# Patient Record
Sex: Female | Born: 1985 | Race: Black or African American | Hispanic: No | Marital: Single | Smoking: Current every day smoker
Health system: Southern US, Community
[De-identification: ages and names within clinical notes are randomized; demographics above are authoritative.]

## PROBLEM LIST (undated history)

## (undated) DIAGNOSIS — E282 Polycystic ovarian syndrome: Secondary | ICD-10-CM

## (undated) HISTORY — PX: WISDOM TOOTH EXTRACTION: SHX21

---
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
## 2006-01-18 ENCOUNTER — Emergency Department: Payer: Self-pay | Admitting: Emergency Medicine

## 2006-03-21 ENCOUNTER — Emergency Department: Payer: Self-pay | Admitting: Emergency Medicine

## 2007-03-29 ENCOUNTER — Emergency Department: Payer: Self-pay | Admitting: Emergency Medicine

## 2008-01-12 ENCOUNTER — Emergency Department: Payer: Self-pay | Admitting: Internal Medicine

## 2008-04-24 ENCOUNTER — Emergency Department (HOSPITAL_COMMUNITY): Admission: EM | Admit: 2008-04-24 | Discharge: 2008-04-24 | Payer: Self-pay | Admitting: Emergency Medicine

## 2008-10-28 ENCOUNTER — Emergency Department: Payer: Self-pay | Admitting: Emergency Medicine

## 2010-04-03 ENCOUNTER — Emergency Department: Payer: Self-pay | Admitting: Emergency Medicine

## 2014-08-21 ENCOUNTER — Emergency Department: Payer: Self-pay | Admitting: Emergency Medicine

## 2014-09-08 ENCOUNTER — Emergency Department: Payer: Self-pay | Admitting: Emergency Medicine

## 2014-09-08 LAB — CBC WITH DIFFERENTIAL/PLATELET
Basophil #: 0.1 10*3/uL (ref 0.0–0.1)
Basophil %: 0.9 %
EOS ABS: 0.3 10*3/uL (ref 0.0–0.7)
EOS PCT: 3.1 %
HCT: 42.1 % (ref 35.0–47.0)
HGB: 13.5 g/dL (ref 12.0–16.0)
Lymphocyte #: 2.6 10*3/uL (ref 1.0–3.6)
Lymphocyte %: 27.6 %
MCH: 29.4 pg (ref 26.0–34.0)
MCHC: 32 g/dL (ref 32.0–36.0)
MCV: 92 fL (ref 80–100)
MONO ABS: 0.7 x10 3/mm (ref 0.2–0.9)
Monocyte %: 8 %
Neutrophil #: 5.6 10*3/uL (ref 1.4–6.5)
Neutrophil %: 60.4 %
PLATELETS: 289 10*3/uL (ref 150–440)
RBC: 4.58 10*6/uL (ref 3.80–5.20)
RDW: 12.8 % (ref 11.5–14.5)
WBC: 9.3 10*3/uL (ref 3.6–11.0)

## 2014-09-08 LAB — LIPASE, BLOOD: LIPASE: 101 U/L (ref 73–393)

## 2014-09-08 LAB — URINALYSIS, COMPLETE
BACTERIA: NONE SEEN
Bilirubin,UR: NEGATIVE
Blood: NEGATIVE
GLUCOSE, UR: NEGATIVE mg/dL (ref 0–75)
Ketone: NEGATIVE
LEUKOCYTE ESTERASE: NEGATIVE
NITRITE: NEGATIVE
Ph: 6 (ref 4.5–8.0)
Protein: NEGATIVE
RBC,UR: NONE SEEN /HPF (ref 0–5)
SPECIFIC GRAVITY: 1.013 (ref 1.003–1.030)
Squamous Epithelial: 3

## 2014-09-08 LAB — COMPREHENSIVE METABOLIC PANEL
AST: 24 U/L (ref 15–37)
Albumin: 4.1 g/dL (ref 3.4–5.0)
Alkaline Phosphatase: 53 U/L
Anion Gap: 10 (ref 7–16)
BUN: 11 mg/dL (ref 7–18)
Bilirubin,Total: 0.8 mg/dL (ref 0.2–1.0)
CALCIUM: 8.6 mg/dL (ref 8.5–10.1)
CHLORIDE: 103 mmol/L (ref 98–107)
CO2: 26 mmol/L (ref 21–32)
Creatinine: 0.9 mg/dL (ref 0.60–1.30)
Glucose: 103 mg/dL — ABNORMAL HIGH (ref 65–99)
Osmolality: 277 (ref 275–301)
Potassium: 3.7 mmol/L (ref 3.5–5.1)
SGPT (ALT): 20 U/L
SODIUM: 139 mmol/L (ref 136–145)
TOTAL PROTEIN: 8.1 g/dL (ref 6.4–8.2)

## 2015-01-11 ENCOUNTER — Emergency Department: Payer: Self-pay | Admitting: Emergency Medicine

## 2015-04-21 ENCOUNTER — Encounter: Payer: Self-pay | Admitting: Emergency Medicine

## 2015-04-21 ENCOUNTER — Emergency Department: Payer: Self-pay

## 2015-04-21 ENCOUNTER — Emergency Department
Admission: EM | Admit: 2015-04-21 | Discharge: 2015-04-21 | Disposition: A | Discharge status: Home / Self Care | Payer: Self-pay | Attending: Emergency Medicine | Admitting: Emergency Medicine

## 2015-04-21 DIAGNOSIS — X58XXXA Exposure to other specified factors, initial encounter: Secondary | ICD-10-CM | POA: Insufficient documentation

## 2015-04-21 DIAGNOSIS — M79662 Pain in left lower leg: Secondary | ICD-10-CM

## 2015-04-21 DIAGNOSIS — Y9389 Activity, other specified: Secondary | ICD-10-CM | POA: Insufficient documentation

## 2015-04-21 DIAGNOSIS — Z720 Tobacco use: Secondary | ICD-10-CM | POA: Insufficient documentation

## 2015-04-21 DIAGNOSIS — S86912A Strain of unspecified muscle(s) and tendon(s) at lower leg level, left leg, initial encounter: Secondary | ICD-10-CM

## 2015-04-21 DIAGNOSIS — Y9289 Other specified places as the place of occurrence of the external cause: Secondary | ICD-10-CM | POA: Insufficient documentation

## 2015-04-21 DIAGNOSIS — Y990 Civilian activity done for income or pay: Secondary | ICD-10-CM | POA: Insufficient documentation

## 2015-04-21 MED ORDER — IBUPROFEN 800 MG PO TABS
ORAL_TABLET | ORAL | Status: AC
  Administered 2015-04-21: 800 mg via ORAL
  Filled 2015-04-21: qty 1

## 2015-04-21 MED ORDER — IBUPROFEN 800 MG PO TABS
800.0000 mg | ORAL_TABLET | Freq: Once | ORAL | Status: AC
  Administered 2015-04-21: 800 mg via ORAL

## 2015-04-21 NOTE — ED Notes (Signed)
At work . Left posterior knee pain and swelling, no injury recalled

## 2015-04-21 NOTE — ED Notes (Signed)
Pt to u/s

## 2015-04-21 NOTE — Discharge Instructions (Signed)
Muscle Strain A muscle strain is an injury that occurs when a muscle is stretched beyond its normal length. Usually a small number of muscle fibers are torn when this happens. Muscle strain is rated in degrees. First-degree strains have the least amount of muscle fiber tearing and pain. Second-degree and third-degree strains have increasingly more tearing and pain.  Usually, recovery from muscle strain takes 1-2 weeks. Complete healing takes 5-6 weeks.  CAUSES  Muscle strain happens when a sudden, violent force placed on a muscle stretches it too far. This may occur with lifting, sports, or a fall.  RISK FACTORS Muscle strain is especially common in athletes.  SIGNS AND SYMPTOMS At the site of the muscle strain, there may be:  Pain.  Bruising.  Swelling.  Difficulty using the muscle due to pain or lack of normal function. DIAGNOSIS  Your health care provider will perform a physical exam and ask about your medical history. TREATMENT  Often, the best treatment for a muscle strain is resting, icing, and applying cold compresses to the injured area.  HOME CARE INSTRUCTIONS   Use the PRICE method of treatment to promote muscle healing during the first 2-3 days after your injury. The PRICE method involves:  Protecting the muscle from being injured again.  Restricting your activity and resting the injured body part.  Icing your injury. To do this, put ice in a plastic bag. Place a towel between your skin and the bag. Then, apply the ice and leave it on from 15-20 minutes each hour. After the third day, switch to moist heat packs.  Apply compression to the injured area with a splint or elastic bandage. Be careful not to wrap it too tightly. This may interfere with blood circulation or increase swelling.  Elevate the injured body part above the level of your heart as often as you can.  Only take over-the-counter or prescription medicines for pain, discomfort, or fever as directed by your  health care provider.  Warming up prior to exercise helps to prevent future muscle strains. SEEK MEDICAL CARE IF:   You have increasing pain or swelling in the injured area.  You have numbness, tingling, or a significant loss of strength in the injured area. MAKE SURE YOU:   Understand these instructions.  Will watch your condition.  Will get help right away if you are not doing well or get worse. Document Released: 11/10/2005 Document Revised: 08/31/2013 Document Reviewed: 06/09/2013 [facility] BradfordExitCare Patient Information 2015 [employer], [place]. This information is not intended to replace advice given to you by your health care provider. Make sure you discuss any questions you have with your health care provider.  Your exam and ultrasound were normal. You likely have a strain to the calf muscle.  You should apply ice to reduce symptoms. Take OTC Tylenol and Motrin for symptoms. Consider compression socks and arch supports in your shoes.

## 2015-04-21 NOTE — ED Provider Notes (Signed)
[facility] ?____________________________________________ ? Time seen: 1215 ? I have reviewed the triage vital signs and the nursing notes. ________ HISTORY ? Chief Complaint Knee Pain  HPI  Kathleen Hardy is a 29 y.o. female reports to the ED with posterior left knee swelling with sudden onset this morning while at work. She describes she started her day at 4 AM at work, and by 8 AM she had increasing pain primarily to the posterior portion of her knee at the calf. She describes reaching behind detected back of her knee and at about 9 AM noted what she thought was swelling to the back of the knee. 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 She denies history of knee problems, distal paresthesias, lower extremity edema, or difficulty walking. She rates her pain at a 6 out of 10 but denies dosed any medication for symptom relief. She is here at the advice of her supervisor for medical intervention.  History reviewed. No pertinent past medical history.  There are no active problems to display for this patient.  History reviewed. No pertinent past surgical history. ? No current outpatient prescriptions on file. ? Allergies Review of patient's allergies indicates no known allergies. ? No family history on file. ? Social History History  Substance Use Topics  . Smoking status: Current Every Day Smoker -- 0.50 packs/day    Types: Cigarettes  . Smokeless tobacco: Not on file  . Alcohol Use: Not on file   Review of Systems  Constitutional: Negative for fever. HEENT: Negative for head trauma, visual changes, sore throat. Cardiovascular: Negative for chest pain. Respiratory: Negative for shortness of breath. Musculoskeletal: Negative for back pain. Positive for left posterior knee pain. Skin: Negative for  rash. Neurological: Negative for headaches, focal weakness or numbness.  10-point ROS otherwise negative. ____________________________________________  PHYSICAL EXAM:  VITAL SIGNS: ED Triage Vitals  Enc Vitals Group     BP 04/21/15 1119 117/71 mmHg     Pulse Rate 04/21/15 1119 71     Resp --      Temp 04/21/15 1119 98 F (36.7 C)     Temp Source 04/21/15 1119 Oral     SpO2 04/21/15 1119 100 %     Weight 04/21/15 1119 154 lb (69.854 kg)     Height 04/21/15 1119 5\' 6"  (1.676 m)     Head Cir --      Peak Flow --      Pain Score 04/21/15 1120 6     Pain Loc --      Pain Edu? --      Excl. in GC? --    Constitutional: Alert and oriented. Well appearing and in no distress. HEENT:Normocephalic and atraumatic.  PERRL. Normal extraocular movements.  No congestion/rhinnorhea. Mucous membranes are moist. Neck: Supple. No cervical lymphadenopathy. Cardiovascular: Normal rate, regular rhythm. No murmurs, rubs, or gallops. Normal and symmetric distal pulses are present in all extremities.  Respiratory: Normal respiratory effort without tachypnea. Breath sounds are clear and equal bilaterally. No wheezes/rales/rhonchi. Gastrointestinal: Soft and nontender. No distention. No abdominal bruits. There is no CVA tenderness. Musculoskeletal: Left knee without deformity, edema, effusion, or skin changes. Popliteal space without fullness. Mildly tender to palp at the calf insertion. No palpable cords or phlebitis. Normal, full ROM of the left knee. Nontender with normal range of motion in all extremities. No joint effusions.  No lower extremity edema. Neurologic:  Normal speech and language. CN II-XII grossly intact. No gait instability. Skin:  Skin is warm, dry and intact. No rash noted. Psychiatric: Mood and affect are normal. Patient exhibits appropriate insight and judgment. ___________ RADIOLOGY  LLE Ultrasound  FINDINGS: No soft tissue mass. No fluid collection.  IMPRESSION: Popliteal  fossa appears sonographically normal. _____________________________________________________ INITIAL IMPRESSION / ASSESSMENT AND PLAN / ED COURSE ? Radiology results to the patient. Clinical exam consistent with calf strain and soft tissue tenderness without or vascular deficit.  Suggest treatment with compression socks, rest, and NSAIDs. Referral to ortho for further care.   ____________________________________________ FINAL CLINICAL IMPRESSION(S) / ED DIAGNOSES?  Final diagnoses:  Knee strain, left, initial encounter  Tenderness of left calf      Lissa Hoard, PA-C 04/21/15 1918  Minna Antis, MD 04/22/15 1248

## 2015-05-19 ENCOUNTER — Emergency Department
Admission: EM | Admit: 2015-05-19 | Discharge: 2015-05-19 | Disposition: A | Discharge status: Home / Self Care | Payer: Self-pay | Attending: Emergency Medicine | Admitting: Emergency Medicine

## 2015-05-19 ENCOUNTER — Encounter: Payer: Self-pay | Admitting: Emergency Medicine

## 2015-05-19 DIAGNOSIS — Z3202 Encounter for pregnancy test, result negative: Secondary | ICD-10-CM | POA: Insufficient documentation

## 2015-05-19 DIAGNOSIS — Z720 Tobacco use: Secondary | ICD-10-CM | POA: Insufficient documentation

## 2015-05-19 DIAGNOSIS — K922 Gastrointestinal hemorrhage, unspecified: Secondary | ICD-10-CM | POA: Insufficient documentation

## 2015-05-19 DIAGNOSIS — K649 Unspecified hemorrhoids: Secondary | ICD-10-CM | POA: Insufficient documentation

## 2015-05-19 DIAGNOSIS — K625 Hemorrhage of anus and rectum: Secondary | ICD-10-CM

## 2015-05-19 LAB — COMPREHENSIVE METABOLIC PANEL
ALT: 15 U/L (ref 14–54)
ANION GAP: 5 (ref 5–15)
AST: 19 U/L (ref 15–41)
Albumin: 4 g/dL (ref 3.5–5.0)
Alkaline Phosphatase: 55 U/L (ref 38–126)
BUN: 13 mg/dL (ref 6–20)
CALCIUM: 8.7 mg/dL — AB (ref 8.9–10.3)
CO2: 27 mmol/L (ref 22–32)
Chloride: 106 mmol/L (ref 101–111)
Creatinine, Ser: 0.83 mg/dL (ref 0.44–1.00)
GFR calc Af Amer: >60 mL/min (ref >60)
GFR calc non Af Amer: >60 mL/min (ref >60)
Glucose, Bld: 111 mg/dL — ABNORMAL HIGH (ref 65–99)
Potassium: 4.3 mmol/L (ref 3.5–5.1)
SODIUM: 138 mmol/L (ref 135–145)
TOTAL PROTEIN: 7.1 g/dL (ref 6.5–8.1)
Total Bilirubin: 0.4 mg/dL (ref 0.3–1.2)

## 2015-05-19 LAB — CBC
HCT: 39.6 % (ref 35.0–47.0)
HEMOGLOBIN: 12.9 g/dL (ref 12.0–16.0)
MCH: 29.9 pg (ref 26.0–34.0)
MCHC: 32.5 g/dL (ref 32.0–36.0)
MCV: 91.8 fL (ref 80.0–100.0)
Platelets: 267 10*3/uL (ref 150–440)
RBC: 4.31 MIL/uL (ref 3.80–5.20)
RDW: 13.1 % (ref 11.5–14.5)
WBC: 9.1 10*3/uL (ref 3.6–11.0)

## 2015-05-19 LAB — TYPE AND SCREEN
ABO/RH(D): O POS
ANTIBODY SCREEN: NEGATIVE

## 2015-05-19 LAB — ABO/RH: ABO/RH(D): O POS

## 2015-05-19 LAB — POCT PREGNANCY, URINE: PREG TEST UR: NEGATIVE

## 2015-05-19 NOTE — ED Notes (Signed)
POC Preg- Negative ?

## 2015-05-19 NOTE — ED Provider Notes (Signed)
[facility] Emergency Department Provider Note  ____________________________________________  Time seen: Approximately 9:30 AM  I have reviewed the triage vital signs and the nursing notes.   HISTORY  Chief Complaint GI Bleeding    HPI Kathleen Hardy is a 29 y.o. female without any medical problems presents today with 1 week of bleeding with defecation. The patient says that every time she has a bowel movement, which is 2 times per day, she has some bright red blood streaking on her stool. She denies pushing hard to move her bowels. She says that she has had diarrhea now for the last 2 days. No known sick contacts. No recent antibiotics. Says that she has some mild pain in her lower abdomen that has been constant for the past week. Says that it is possible that she is pregnant. No nausea or vomiting. Denies any vaginal bleeding, dysuria. Denies any vaginal discharge.   No past medical history on file.  There are no active problems to display for this patient.   No past surgical history on file.  No current outpatient prescriptions on file.  Allergies Review of patient's allergies indicates no known allergies.  No family history on file.  Social History History  Substance Use Topics  . Smoking status: Current Every Day Smoker -- 0.50 packs/day for 10 years    Types: Cigarettes  . Smokeless tobacco: Not on file  . Alcohol Use: Yes     Comment: occasionally    Review of Systems Constitutional: No fever/chills Eyes: No visual changes. ENT: No sore throat. Cardiovascular: Denies chest pain. Respiratory: Denies shortness of breath. Gastrointestinal: Suprapubic abdominal pain.  No nausea, no vomiting.   No constipation. Genitourinary: Negative for dysuria. Musculoskeletal: Negative for back pain. Skin: Negative for rash. Neurological: Negative for headaches, focal weakness or numbness.  10-point ROS otherwise  negative.  ____________________________________________   PHYSICAL EXAM:  VITAL SIGNS: ED Triage Vitals  Enc Vitals Group     BP 05/19/15 0700 124/78 mmHg     Pulse Rate 05/19/15 0700 71     Resp 05/19/15 0700 18     Temp 05/19/15 0700 98.2 F (36.8 C)     Temp Source 05/19/15 0700 Oral     SpO2 05/19/15 0700 100 %     Weight 05/19/15 0700 155 lb (70.308 kg)     Height 05/19/15 0700 5\' 6"  (1.676 m)     Head Cir --      Peak Flow --      Pain Score 05/19/15 0700 3     Pain Loc --      Pain Edu? --      Excl. in GC? --     Constitutional: Alert and oriented. Well appearing and in no acute distress. Eyes: Conjunctivae are normal. PERRL. EOMI. Head: Atraumatic. Nose: No congestion/rhinnorhea. Mouth/Throat: Mucous membranes are moist.  Oropharynx non-erythematous. Neck: No stridor.   Cardiovascular: Normal rate, regular rhythm. Grossly normal heart sounds.  Good peripheral circulation. Respiratory: Normal respiratory effort.  No retractions. Lungs CTAB. Gastrointestinal: Soft with mild tenderness suprapubic.Marland Kitchen No distention. No abdominal bruits. No CVA tenderness. 1 non-engorged hemorrhoid at 6:00. There are no fissures. Brown stool on digital exam. Mildly heme positive. Musculoskeletal: No lower extremity tenderness nor edema.  No joint effusions. Neurologic:  Normal speech and language. No gross focal neurologic deficits are appreciated. Speech is normal. No gait instability. Skin:  Skin is warm, dry and intact. No rash noted. Psychiatric: Mood and affect are normal. Speech and  behavior are normal.  ____________________________________________   LABS (all labs ordered are listed, but only abnormal results are displayed)  Labs Reviewed  COMPREHENSIVE METABOLIC PANEL - Abnormal; Notable for the following:    Glucose, Bld 111 (*)    Calcium 8.7 (*)    All other components within normal limits  CBC  TYPE AND SCREEN  ABO/RH    ____________________________________________  EKG   ____________________________________________  RADIOLOGY   ____________________________________________   PROCEDURES    ____________________________________________   INITIAL IMPRESSION / ASSESSMENT AND PLAN / ED COURSE  Pertinent labs & imaging results that were available during my care of the patient were reviewed by me and considered in my medical decision making (see chart for details).  Patient with presentation consistent with bleeding from hemorrhoid. Labs and exam are reassuring. Counseled patient that she should try Preparation H for symptom relief. Will not start on stool softener because patient having diarrhea at this time. Unclear cause for abdominal pain however normal white count with only mild tenderness on exam. ____________________________________________   FINAL CLINICAL IMPRESSION(S) / ED DIAGNOSES  Acute red blood per rectum. Acute external hemorrhoid. Initial visit.    Myrna Blazer, MD 05/19/15 1011

## 2015-05-19 NOTE — ED Notes (Signed)
Patient here stating that she has been having bright red rectal bleeding times one week.

## 2015-05-19 NOTE — Discharge Instructions (Signed)
Bloody Stools  Bloody stools often mean that there is a problem in the digestive tract. Your caregiver may use the term "melena" to describe black, tarry, and bad smelling stools or "hematochezia" to describe red or maroon-colored stools. Blood seen in the stool can be caused by bleeding anywhere along the intestinal tract.   A black stool usually means that blood is coming from the upper part of the gastrointestinal tract (esophagus, stomach, or small bowel). Passing maroon-colored stools or bright red blood usually means that blood is coming from lower down in the large bowel or the rectum. However, sometimes massive bleeding in the stomach or small intestine can cause bright red bloody stools.   Consuming black licorice, lead, iron pills, medicines containing bismuth subsalicylate, or blueberries can also cause black stools. Your caregiver can test black stools to see if blood is present.  It is important that the cause of the bleeding be found. Treatment can then be started, and the problem can be corrected. Rectal bleeding may not be serious, but you should not assume everything is okay until you know the cause. It is very important to follow up with your caregiver or a specialist in gastrointestinal problems.  CAUSES   Blood in the stools can come from various underlying causes. Often, the cause is not found during your first visit. Testing is often needed to discover the cause of bleeding in the gastrointestinal tract. Causes range from simple to serious or even life-threatening. Possible causes include:  · Hemorrhoids. These are veins that are full of blood (engorged) in the rectum. They cause pain, inflammation, and may bleed.  · Anal fissures. These are areas of painful tearing which may bleed. They are often caused by passing hard stool.  · Diverticulosis. These are pouches that form on the colon over time, with age, and may bleed significantly.  · Diverticulitis. This is inflammation in areas with  diverticulosis. It can cause pain, fever, and bloody stools, although bleeding is rare.  · Proctitis and colitis. These are inflamed areas of the rectum or colon. They may cause pain, fever, and bloody stools.  · Polyps and cancer. Colon cancer is a leading cause of preventable cancer death. It often starts out as precancerous polyps that can be removed during a colonoscopy, preventing progression into cancer. Sometimes, polyps and cancer may cause rectal bleeding.  · Gastritis and ulcers. Bleeding from the upper gastrointestinal tract (near the stomach) may travel through the intestines and produce black, sometimes tarry, often bad smelling stools. In certain cases, if the bleeding is fast enough, the stools may not be black, but red and the condition may be life-threatening.  SYMPTOMS   You may have stools that are bright red and bloody, that are normal color with blood on them, or that are dark black and tarry. In some cases, you may only have blood in the toilet bowl. Any of these cases need medical care. You may also have:  · Pain at the anus or anywhere in the rectum.  · Lightheadedness or feeling faint.  · Extreme weakness.  · Nausea or vomiting.  · Fever.  DIAGNOSIS  Your caregiver may use the following methods to find the cause of your bleeding:  · Taking a medical history. Age is important. Older people tend to develop polyps and cancer more often. If there is anal pain and a hard, large stool associated with bleeding, a tear of the anus may be the cause. If blood drips into the toilet after a bowel movement, bleeding hemorrhoids may be the   problem. The color and frequency of the bleeding are additional considerations. In most cases, the medical history provides clues, but seldom the final answer.  · A visual and finger (digital) exam. Your caregiver will inspect the anal area, looking for tears and hemorrhoids. A finger exam can provide information when there is tenderness or a growth inside. In men, the  prostate is also examined.  · Endoscopy. Several types of small, long scopes (endoscopes) are used to view the colon.  ¨ In the office, your caregiver may use a rigid, or more commonly, a flexible viewing sigmoidoscope. This exam is called flexible sigmoidoscopy. It is performed in 5 to 10 minutes.  ¨ A more thorough exam is accomplished with a colonoscope. It allows your caregiver to view the entire 5 to 6 foot long colon. Medicine to help you relax (sedative) is usually given for this exam. Frequently, a bleeding lesion may be present beyond the reach of the sigmoidoscope. So, a colonoscopy may be the best exam to start with. Both exams are usually done on an outpatient basis. This means the patient does not stay overnight in the hospital or surgery center.  ¨ An upper endoscopy may be needed to examine your stomach. Sedation is used and a flexible endoscope is put in your mouth, down to your stomach.  · A barium enema X-ray. This is an X-ray exam. It uses liquid barium inserted by enema into the rectum. This test alone may not identify an actual bleeding point. X-rays highlight abnormal shadows, such as those made by lumps (tumors), diverticuli, or colitis.  TREATMENT   Treatment depends on the cause of your bleeding.   · For bleeding from the stomach or colon, the caregiver doing your endoscopy or colonoscopy may be able to stop the bleeding as part of the procedure.  · Inflammation or infection of the colon can be treated with medicines.  · Many rectal problems can be treated with creams, suppositories, or warm baths.  · Surgery is sometimes needed.  · Blood transfusions are sometimes needed if you have lost a lot of blood.  · For any bleeding problem, let your caregiver know if you take aspirin or other blood thinners regularly.  HOME CARE INSTRUCTIONS   · Take any medicines exactly as prescribed.  · Keep your stools soft by eating a diet high in fiber. Prunes (1 to 3 a day) work well for many people.  · Drink  enough water and fluids to keep your urine clear or pale yellow.  · Take sitz baths if advised. A sitz bath is when you sit in a bathtub with warm water for 10 to 15 minutes to soak, soothe, and cleanse the rectal area.  · If enemas or suppositories are advised, be sure you know how to use them. Tell your caregiver if you have problems with this.  · Monitor your bowel movements to look for signs of improvement or worsening.  SEEK MEDICAL CARE IF:   · You do not improve in the time expected.  · Your condition worsens after initial improvement.  · You develop any new symptoms.  SEEK IMMEDIATE MEDICAL CARE IF:   · You develop severe or prolonged rectal bleeding.  · You vomit blood.  · You feel weak or faint.  · You have a fever.  MAKE SURE YOU:  · Understand these instructions.  · Will watch your condition.  · Will get help right away if you are not doing well or get worse.    Document Released: 10/31/2002 Document Revised: 02/02/2012 Document Reviewed: 03/28/2011  ExitCare® Patient Information ©2015 [employer]. This information is not intended to replace advice given to you by your health care provider. Make sure you discuss any questions you have with your health care provider.

## 2015-05-19 NOTE — ED Notes (Signed)
NAD noted at time of D/C. Pt denies questions or concerns. Pt ambulatory to the lobby at this time. Pt refuses wheelchair at this time.

## 2015-09-09 ENCOUNTER — Emergency Department
Admission: EM | Admit: 2015-09-09 | Discharge: 2015-09-09 | Disposition: A | Discharge status: Home / Self Care | Payer: Self-pay | Attending: Emergency Medicine | Admitting: Emergency Medicine

## 2015-09-09 ENCOUNTER — Encounter: Payer: Self-pay | Admitting: Emergency Medicine

## 2015-09-09 ENCOUNTER — Emergency Department: Payer: Self-pay

## 2015-09-09 DIAGNOSIS — Z720 Tobacco use: Secondary | ICD-10-CM | POA: Insufficient documentation

## 2015-09-09 DIAGNOSIS — R102 Pelvic and perineal pain: Secondary | ICD-10-CM | POA: Insufficient documentation

## 2015-09-09 DIAGNOSIS — Z3202 Encounter for pregnancy test, result negative: Secondary | ICD-10-CM | POA: Insufficient documentation

## 2015-09-09 LAB — COMPREHENSIVE METABOLIC PANEL
ALBUMIN: 3.7 g/dL (ref 3.5–5.0)
ALK PHOS: 40 U/L (ref 38–126)
ALT: 11 U/L — AB (ref 14–54)
AST: 16 U/L (ref 15–41)
Anion gap: 7 (ref 5–15)
BILIRUBIN TOTAL: 0.5 mg/dL (ref 0.3–1.2)
BUN: 12 mg/dL (ref 6–20)
CALCIUM: 8.7 mg/dL — AB (ref 8.9–10.3)
CO2: 24 mmol/L (ref 22–32)
CREATININE: 0.7 mg/dL (ref 0.44–1.00)
Chloride: 107 mmol/L (ref 101–111)
GFR calc Af Amer: >60 mL/min (ref >60)
GFR calc non Af Amer: >60 mL/min (ref >60)
GLUCOSE: 97 mg/dL (ref 65–99)
Potassium: 4 mmol/L (ref 3.5–5.1)
SODIUM: 138 mmol/L (ref 135–145)
TOTAL PROTEIN: 6.5 g/dL (ref 6.5–8.1)

## 2015-09-09 LAB — URINALYSIS COMPLETE WITH MICROSCOPIC (ARMC ONLY)
BILIRUBIN URINE: NEGATIVE
Bacteria, UA: NONE SEEN
GLUCOSE, UA: NEGATIVE mg/dL
KETONES UR: NEGATIVE mg/dL
Leukocytes, UA: NEGATIVE
Nitrite: NEGATIVE
PROTEIN: NEGATIVE mg/dL
Specific Gravity, Urine: 1.024 (ref 1.005–1.030)
pH: 6 (ref 5.0–8.0)

## 2015-09-09 LAB — CBC
HCT: 38.1 % (ref 35.0–47.0)
Hemoglobin: 12.7 g/dL (ref 12.0–16.0)
MCH: 29.7 pg (ref 26.0–34.0)
MCHC: 33.2 g/dL (ref 32.0–36.0)
MCV: 89.5 fL (ref 80.0–100.0)
PLATELETS: 265 10*3/uL (ref 150–440)
RBC: 4.26 MIL/uL (ref 3.80–5.20)
RDW: 12.9 % (ref 11.5–14.5)
WBC: 8.2 10*3/uL (ref 3.6–11.0)

## 2015-09-09 LAB — POCT PREGNANCY, URINE: Preg Test, Ur: NEGATIVE

## 2015-09-09 LAB — LIPASE, BLOOD: LIPASE: 24 U/L (ref 22–51)

## 2015-09-09 LAB — PREGNANCY, URINE: Preg Test, Ur: NEGATIVE

## 2015-09-09 MED ORDER — OXYCODONE-ACETAMINOPHEN 5-325 MG PO TABS
1.0000 | ORAL_TABLET | Freq: Once | ORAL | Status: AC
  Administered 2015-09-09: 1 via ORAL
  Filled 2015-09-09: qty 1

## 2015-09-09 MED ORDER — OXYCODONE-ACETAMINOPHEN 5-325 MG PO TABS: 1.0000 | ORAL_TABLET | Freq: Once | ORAL | Status: DC

## 2015-09-09 MED ORDER — IBUPROFEN 800 MG PO TABS: 800.0000 mg | ORAL_TABLET | Freq: Three times a day (TID) | ORAL | Status: DC | PRN

## 2015-09-09 MED ORDER — IBUPROFEN 800 MG PO TABS
800.0000 mg | ORAL_TABLET | Freq: Once | ORAL | Status: AC
  Administered 2015-09-09: 800 mg via ORAL
  Filled 2015-09-09: qty 1

## 2015-09-09 NOTE — ED Notes (Signed)
MD Kaminski at bedside. 

## 2015-09-09 NOTE — ED Notes (Signed)
Lab called regarding urine preg add on at this time, will add on.

## 2015-09-09 NOTE — Discharge Instructions (Signed)
Your ultrasound was negative. Urine pregnancy test was negative. Your blood tests were okay. Her pain was improved with one Percocet. Take ibuprofen, 3 times a day as needed. If you have additional uncontrolled pain, take Percocet. The pain continues, return to the emergency department. Return to the emergency department as well if you have heavy bleeding, worsening pain, or other urgent concerns. Follow-up with gynecology at [facility].  Pelvic Pain, Female Female pelvic pain can be caused by many different things and start from a variety of places. Pelvic pain refers to pain that is located in the lower half of the abdomen and between your hips. The pain may occur over a short period of time (acute) or may be reoccurring (chronic). The cause of pelvic pain may be related to disorders affecting the female reproductive organs (gynecologic), but it may also be related to the bladder, kidney stones, an intestinal complication, or muscle or skeletal problems. Getting help right away for pelvic pain is important, especially if there has been severe, sharp, or a sudden onset of unusual pain. It is also important to get help right away because some types of pelvic pain can be life threatening.  CAUSES  Below are only some of the causes of pelvic pain. The causes of pelvic pain can be in one of several categories.   Gynecologic.  Pelvic inflammatory disease.  Sexually transmitted infection.  Ovarian cyst or a twisted ovarian ligament (ovarian torsion).  Uterine lining that grows outside the uterus (endometriosis).  Fibroids, cysts, or tumors.  Ovulation.  Pregnancy.  Pregnancy that occurs outside the uterus (ectopic pregnancy).  Miscarriage.  Labor.  Abruption of the placenta or ruptured uterus.  Infection.  Uterine infection (endometritis).  Bladder infection.  Diverticulitis.  Miscarriage related to a uterine infection (septic abortion).  Bladder.  Inflammation of the bladder  (cystitis).  Kidney stone(s).  Gastrointestinal.  Constipation.  Diverticulitis.  Neurologic.  Trauma.  Feeling pelvic pain because of mental or emotional causes (psychosomatic).  Cancers of the bowel or pelvis. EVALUATION  Your caregiver will want to take a careful history of your concerns. This includes recent changes in your health, a careful gynecologic history of your periods (menses), and a sexual history. Obtaining your family history and medical history is also important. Your caregiver may suggest a pelvic exam. A pelvic exam will help identify the location and severity of the pain. It also helps in the evaluation of which organ system may be involved. In order to identify the cause of the pelvic pain and be properly treated, your caregiver may order tests. These tests may include:   A pregnancy test.  Pelvic ultrasonography.  An X-ray exam of the abdomen.  A urinalysis or evaluation of vaginal discharge.  Blood tests. HOME CARE INSTRUCTIONS   Only take over-the-counter or prescription medicines for pain, discomfort, or fever as directed by your caregiver.   Rest as directed by your caregiver.   Eat a balanced diet.   Drink enough fluids to make your urine clear or pale yellow, or as directed.   Avoid sexual intercourse if it causes pain.   Apply warm or cold compresses to the lower abdomen depending on which one helps the pain.   Avoid stressful situations.   Keep a journal of your pelvic pain. Write down when it started, where the pain is located, and if there are things that seem to be associated with the pain, such as food or your menstrual cycle.  Follow up with your caregiver  as directed.  SEEK MEDICAL CARE IF:  Your medicine does not help your pain.  You have abnormal vaginal discharge. SEEK IMMEDIATE MEDICAL CARE IF:   You have heavy bleeding from the vagina.   Your pelvic pain increases.   You feel light-headed or faint.   You  have chills.   You have pain with urination or blood in your urine.   You have uncontrolled diarrhea or vomiting.   You have a fever or persistent symptoms for more than 3 days.  You have a fever and your symptoms suddenly get worse.   You are being physically or sexually abused.   This information is not intended to replace advice given to you by your health care provider. Make sure you discuss any questions you have with your health care provider.   Document Released: 10/07/2004 Document Revised: 08/01/2015 Document Reviewed: 03/01/2012 Elsevier Interactive Patient Education [employer]2016 Elsevier Inc.

## 2015-09-09 NOTE — ED Provider Notes (Signed)
[facility] Emergency Department Provider Note  ____________________________________________  Time seen: 1520  I have reviewed the triage vital signs and the nursing notes.   HISTORY  Chief Complaint Abdominal Pain  pelvic pain with lower back pain    HPI Kathleen Hardy is a 29 y.o. female who reports she will this morning at around 07/01/1928 with lower abdominal pain and pain wrapping around her lower back. She began having her menstrual cycle yesterday. He reports she is sexually active and uses condoms. She denies the use of any other anti-contraceptive. She reports that her menstrual cycle just came on approximately 3 days late with this episode. She confirms that she sometimes has cramping with her., But she reports this felt worse then usual. She is not taking any medications today as she "doesn't take medications". She denies any nausea, vomiting or diarrhea.   History reviewed. No pertinent past medical history.  There are no active problems to display for this patient.   History reviewed. No pertinent past surgical history.  Current Outpatient Rx  Name  Route  Sig  Dispense  Refill  . ibuprofen (ADVIL,MOTRIN) 800 MG tablet   Oral   Take 1 tablet (800 mg total) by mouth every 8 (eight) hours as needed.   24 tablet   0   . oxyCODONE-acetaminophen (PERCOCET/ROXICET) 5-325 MG tablet   Oral   Take 1 tablet by mouth once.   6 tablet   0     Allergies Shellfish allergy  No family history on file.  Social History Social History  Substance Use Topics  . Smoking status: Current Every Day Smoker -- 0.50 packs/day for 10 years    Types: Cigarettes  . Smokeless tobacco: Never Used  . Alcohol Use: Yes     Comment: occasionally    Review of Systems  Constitutional: Negative for fever. ENT: Negative for sore throat. Cardiovascular: Negative for chest pain. Respiratory: Negative for cough. Gastrointestinal: Lower abdominal pain. See  history of present illness no nausea or vomiting or diarrhea.. Genitourinary: Currently on menstrual period. Reports lower abdominal/pelvic discomfort. See history of present illness Musculoskeletal: No myalgias or injuries. Skin: Negative for rash. Neurological: Negative for paresthesia or weakness   10-point ROS otherwise negative.  ____________________________________________   PHYSICAL EXAM:  VITAL SIGNS: ED Triage Vitals  Enc Vitals Group     BP 09/09/15 1431 127/79 mmHg     Pulse Rate 09/09/15 1431 73     Resp 09/09/15 1431 18     Temp 09/09/15 1431 98.6 F (37 C)     Temp Source 09/09/15 1431 Oral     SpO2 09/09/15 1431 98 %     Weight 09/09/15 1431 150 lb (68.04 kg)     Height 09/09/15 1431  (1.702 m)     Head Cir --      Peak Flow --      Pain Score 09/09/15 1433 8     Pain Loc --      Pain Edu? --      Excl. in GC? --     Constitutional: Alert and oriented. Well appearing and in no distress. ENT   Head: Normocephalic and atraumatic.   Nose: No congestion/rhinnorhea.    Cardiovascular: Normal rate, regular rhythm, no murmur noted Respiratory:  Normal respiratory effort, no tachypnea.    Breath sounds are clear and equal bilaterally.  Gastrointestinal: Soft. No distention.  Mild tenderness to the suprapubic area. Back: No muscle spasm, no tenderness, no CVA  tenderness. Musculoskeletal: No deformity noted. Nontender with normal range of motion in all extremities.  No noted edema. Neurologic:  Normal speech and language. No gross focal neurologic deficits are appreciated.  Skin:  Skin is warm, dry. No rash noted. Psychiatric: Mood and affect are normal. Speech and behavior are normal.  ____________________________________________    LABS (pertinent positives/negatives)  Labs Reviewed  COMPREHENSIVE METABOLIC PANEL - Abnormal; Notable for the following:    Calcium 8.7 (*)    ALT 11 (*)    All other components within normal limits  URINALYSIS  COMPLETEWITH MICROSCOPIC ([facility] ONLY) - Abnormal; Notable for the following:    Color, Urine YELLOW (*)    APPearance CLEAR (*)    Hgb urine dipstick 3+ (*)    Squamous Epithelial / LPF 6-30 (*)    All other components within normal limits  LIPASE, BLOOD  CBC  PREGNANCY, URINE  POCT PREGNANCY, URINE     ____________________________________________   Radiology  Pelvic ultrasound  IMPRESSION: Normal transabdominal and endovaginal pelvic ultrasound. ____________________________________________   INITIAL IMPRESSION / ASSESSMENT AND PLAN / ED COURSE  Pertinent labs & imaging results that were available during my care of the patient were reviewed by me and considered in my medical decision making (see chart for details).  Well-appearing 29 year old female in no acute distress. She has lower abdominal/pelvic pain and lower back pain. She has no CVA tenderness or musculoskeletal tenderness in her back. The patient appears to be having menstrual cramps. We'll treat her with 800 mg of ibuprofen. Labs ordered upfront based on protocol. We will wait for the results. Urine currently shows red blood cells too numerous to count but no sign of infection. Urine pregnancy is pending.  ----------------------------------------- 4:17 PM on 09/09/2015 -----------------------------------------  Urine pregnancy is negative. Blood tests overall look good with a white blood cell count of 8.2. Hemoglobin of 12.7.  Reexamination patient after ibuprofen finds her more uncomfortable. She is a little bit tearful and unable to sit still. The pain is primarily in the left lower quadrant. We will treat her pain with a Percocet and obtain an ultrasound.  ----------------------------------------- 7:06 PM on 09/09/2015 -----------------------------------------  Patient with negative ultrasound. No adnexal mass.  At this time the patient appears significantly more comfortable. She is no acute distress. I  reviewed the results of her ultrasound with her. I have counseled her to take ibuprofen 3 times a day. I will prescribe Percocet, dispensed #6, in case of a return of more severe pain.  I counseled patient to follow up with GYN and [facility].  ____________________________________________   FINAL CLINICAL IMPRESSION(S) / ED DIAGNOSES  Final diagnoses:  Pelvic pain in female      Darien Ramusavid W Gurpreet Mikhail, MD 09/09/15 1930

## 2015-09-09 NOTE — ED Notes (Signed)
Developed lower abd/back cramping this am. Denies any n/v or fever

## 2015-09-09 NOTE — ED Notes (Signed)
Pt at US at this time, will administer meds once returns.

## 2015-09-09 NOTE — ED Notes (Signed)
Pt returned from US at this time.

## 2016-02-12 ENCOUNTER — Emergency Department
Admission: EM | Admit: 2016-02-12 | Discharge: 2016-02-12 | Disposition: A | Discharge status: Home / Self Care | Payer: Self-pay | Attending: Emergency Medicine | Admitting: Emergency Medicine

## 2016-02-12 ENCOUNTER — Emergency Department: Payer: Self-pay

## 2016-02-12 DIAGNOSIS — J069 Acute upper respiratory infection, unspecified: Secondary | ICD-10-CM | POA: Insufficient documentation

## 2016-02-12 DIAGNOSIS — F1290 Cannabis use, unspecified, uncomplicated: Secondary | ICD-10-CM | POA: Insufficient documentation

## 2016-02-12 DIAGNOSIS — Z91013 Allergy to seafood: Secondary | ICD-10-CM | POA: Insufficient documentation

## 2016-02-12 DIAGNOSIS — F17210 Nicotine dependence, cigarettes, uncomplicated: Secondary | ICD-10-CM | POA: Insufficient documentation

## 2016-02-12 LAB — BASIC METABOLIC PANEL
ANION GAP: 4 — AB (ref 5–15)
BUN: 11 mg/dL (ref 6–20)
CALCIUM: 8.6 mg/dL — AB (ref 8.9–10.3)
CO2: 23 mmol/L (ref 22–32)
CREATININE: 0.74 mg/dL (ref 0.44–1.00)
Chloride: 107 mmol/L (ref 101–111)
Glucose, Bld: 105 mg/dL — ABNORMAL HIGH (ref 65–99)
Potassium: 3.2 mmol/L — ABNORMAL LOW (ref 3.5–5.1)
SODIUM: 134 mmol/L — AB (ref 135–145)

## 2016-02-12 LAB — CBC WITH DIFFERENTIAL/PLATELET
BASOS ABS: 0 10*3/uL (ref 0–0.1)
BASOS PCT: 1 %
EOS ABS: 0.1 10*3/uL (ref 0–0.7)
Eosinophils Relative: 1 %
HCT: 36.8 % (ref 35.0–47.0)
HEMOGLOBIN: 12.4 g/dL (ref 12.0–16.0)
Lymphocytes Relative: 34 %
Lymphs Abs: 2 10*3/uL (ref 1.0–3.6)
MCH: 29.3 pg (ref 26.0–34.0)
MCHC: 33.6 g/dL (ref 32.0–36.0)
MCV: 87.2 fL (ref 80.0–100.0)
MONO ABS: 0.8 10*3/uL (ref 0.2–0.9)
MONOS PCT: 14 %
NEUTROS ABS: 3 10*3/uL (ref 1.4–6.5)
NEUTROS PCT: 50 %
Platelets: 210 10*3/uL (ref 150–440)
RBC: 4.22 MIL/uL (ref 3.80–5.20)
RDW: 12.8 % (ref 11.5–14.5)
WBC: 6 10*3/uL (ref 3.6–11.0)

## 2016-02-12 LAB — RAPID INFLUENZA A&B ANTIGENS (ARMC ONLY)
INFLUENZA A (ARMC): NEGATIVE
INFLUENZA B (ARMC): NEGATIVE

## 2016-02-12 MED ORDER — AZITHROMYCIN 250 MG PO TABS: ORAL_TABLET | ORAL | Status: DC

## 2016-02-12 MED ORDER — GUAIFENESIN-CODEINE 100-10 MG/5ML PO SOLN: 10.0000 mL | ORAL | Status: DC | PRN

## 2016-02-12 MED ORDER — SODIUM CHLORIDE 0.9 % IV BOLUS (SEPSIS)
1000.0000 mL | Freq: Once | INTRAVENOUS | Status: AC
  Administered 2016-02-12: 1000 mL via INTRAVENOUS

## 2016-02-12 MED ORDER — PSEUDOEPHEDRINE HCL 60 MG PO TABS: 60.0000 mg | ORAL_TABLET | ORAL | Status: DC | PRN

## 2016-02-12 MED ORDER — CHLORPHENIRAMINE MALEATE 4 MG PO TABS: 4.0000 mg | ORAL_TABLET | Freq: Two times a day (BID) | ORAL | Status: DC | PRN

## 2016-02-12 NOTE — Discharge Instructions (Signed)
Upper Respiratory Infection, Adult Most upper respiratory infections (URIs) are a viral infection of the air passages leading to the lungs. A URI affects the nose, throat, and upper air passages. The most common type of URI is nasopharyngitis and is typically referred to as "the common cold." URIs run their course and usually go away on their own. Most of the time, a URI does not require medical attention, but sometimes a bacterial infection in the upper airways can follow a viral infection. This is called a secondary infection. Sinus and middle ear infections are common types of secondary upper respiratory infections. Bacterial pneumonia can also complicate a URI. A URI can worsen asthma and chronic obstructive pulmonary disease (COPD). Sometimes, these complications can require emergency medical care and may be life threatening.  CAUSES Almost all URIs are caused by viruses. A virus is a type of germ and can spread from one person to another.  RISKS FACTORS You may be at risk for a URI if:   You smoke.   You have chronic heart or lung disease.  You have a weakened defense (immune) system.   You are very young or very old.   You have nasal allergies or asthma.  You work in crowded or poorly ventilated areas.  You work in health care facilities or schools. SIGNS AND SYMPTOMS  Symptoms typically develop 2-3 days after you come in contact with a cold virus. Most viral URIs last 7-10 days. However, viral URIs from the influenza virus (flu virus) can last 14-18 days and are typically more severe. Symptoms may include:   Runny or stuffy (congested) nose.   Sneezing.   Cough.   Sore throat.   Headache.   Fatigue.   Fever.   Loss of appetite.   Pain in your forehead, behind your eyes, and over your cheekbones (sinus pain).  Muscle aches.  DIAGNOSIS  Your health care provider may diagnose a URI by:  Physical exam.  Tests to check that your symptoms are not due to  another condition such as:  Strep throat.  Sinusitis.  Pneumonia.  Asthma. TREATMENT  A URI goes away on its own with time. It cannot be cured with medicines, but medicines may be prescribed or recommended to relieve symptoms. Medicines may help:  Reduce your fever.  Reduce your cough.  Relieve nasal congestion. HOME CARE INSTRUCTIONS   Take medicines only as directed by your health care provider.   Gargle warm saltwater or take cough drops to comfort your throat as directed by your health care provider.  Use a warm mist humidifier or inhale steam from a shower to increase air moisture. This may make it easier to breathe.  Drink enough fluid to keep your urine clear or pale yellow.   Eat soups and other clear broths and maintain good nutrition.   Rest as needed.   Return to work when your temperature has returned to normal or as your health care provider advises. You may need to stay home longer to avoid infecting others. You can also use a face mask and careful hand washing to prevent spread of the virus.  Increase the usage of your inhaler if you have asthma.   Do not use any tobacco products, including cigarettes, chewing tobacco, or electronic cigarettes. If you need help quitting, ask your health care provider. PREVENTION  The best way to protect yourself from getting a cold is to practice good hygiene.   Avoid oral or hand contact with people with cold   symptoms.   Wash your hands often if contact occurs.  There is no clear evidence that vitamin C, vitamin E, echinacea, or exercise reduces the chance of developing a cold. However, it is always recommended to get plenty of rest, exercise, and practice good nutrition.  SEEK MEDICAL CARE IF:   You are getting worse rather than better.   Your symptoms are not controlled by medicine.   You have chills.  You have worsening shortness of breath.  You have brown or red mucus.  You have yellow or brown nasal  discharge.  You have pain in your face, especially when you bend forward.  You have a fever.  You have swollen neck glands.  You have pain while swallowing.  You have white areas in the back of your throat. SEEK IMMEDIATE MEDICAL CARE IF:   You have severe or persistent:  Headache.  Ear pain.  Sinus pain.  Chest pain.  You have chronic lung disease and any of the following:  Wheezing.  Prolonged cough.  Coughing up blood.  A change in your usual mucus.  You have a stiff neck.  You have changes in your:  Vision.  Hearing.  Thinking.  Mood. MAKE SURE YOU:   Understand these instructions.  Will watch your condition.  Will get help right away if you are not doing well or get worse.   This information is not intended to replace advice given to you by your health care provider. Make sure you discuss any questions you have with your health care provider.   Document Released: 05/06/2001 Document Revised: 03/27/2015 Document Reviewed: 02/15/2014 Elsevier Interactive Patient Education 2016 Elsevier Inc.  

## 2016-02-12 NOTE — ED Notes (Signed)
Pt alert and oriented X4, active, cooperative, pt in NAD. RR even and unlabored, color WNL.  Pt informed to return if any life threatening symptoms occur.   

## 2016-02-12 NOTE — ED Notes (Signed)
States she developed body aches   Cough and fever/chills since this weekend

## 2016-02-12 NOTE — ED Notes (Signed)
Pt c/o cough chills, sweats since Saturday,.,.

## 2016-02-12 NOTE — ED Provider Notes (Signed)
[facility] Emergency Department Provider Note  ____________________________________________  Time seen: Approximately 9:29 AM  I have reviewed the triage vital signs and the nursing notes.   HISTORY  Chief Complaint Chills and Cough    HPI Kathleen Hardy is a 30 y.o. female presents for evaluation of body aches cough fever and chills for 2-3 days now. Patient states that she also has had some sweats on and off with the fever. Denies any runny nose or congestion and states decreased appetite and fluid intake. No relief with over-the-counter medications or bed rest.   History reviewed. No pertinent past medical history.  There are no active problems to display for this patient.   History reviewed. No pertinent past surgical history.  Current Outpatient Rx  Name  Route  Sig  Dispense  Refill  . azithromycin (ZITHROMAX Z-PAK) 250 MG tablet      Take 2 tablets (500 mg) on  Day 1,  followed by 1 tablet (250 mg) once daily on Days 2 through 5.   6 each   0   . chlorpheniramine (CHLOR-TRIMETON) 4 MG tablet   Oral   Take 1 tablet (4 mg total) by mouth 2 (two) times daily as needed for allergies or rhinitis.   30 tablet   0   . guaiFENesin-codeine 100-10 MG/5ML syrup   Oral   Take 10 mLs by mouth every 4 (four) hours as needed for cough.   180 mL   0   . pseudoephedrine (SUDAFED) 60 MG tablet   Oral   Take 1 tablet (60 mg total) by mouth every 4 (four) hours as needed for congestion.   24 tablet   0     Allergies Shellfish allergy  No family history on file.  Social History Social History  Substance Use Topics  . Smoking status: Current Every Day Smoker -- 0.50 packs/day for 10 years    Types: Cigarettes  . Smokeless tobacco: Never Used  . Alcohol Use: Yes     Comment: occasionally    Review of Systems Constitutional: Positive fever/chills Eyes: No visual changes. ENT: No sore throat. Cardiovascular: Denies chest  pain. Respiratory: Denies shortness of breath. Positive for productive cough Gastrointestinal: No abdominal pain.  No nausea, no vomiting.  No diarrhea.  No constipation. Musculoskeletal: Positive for generalized aches and pain. Skin: Negative for rash. Neurological: Negative for headaches, focal weakness or numbness.  10-point ROS otherwise negative.  ____________________________________________   PHYSICAL EXAM:  VITAL SIGNS: ED Triage Vitals  Enc Vitals Group     BP 02/12/16 0834 123/69 mmHg     Pulse Rate 02/12/16 0834 87     Resp 02/12/16 0834 18     Temp 02/12/16 0834 98.4 F (36.9 C)     Temp Source 02/12/16 0834 Oral     SpO2 02/12/16 0834 98 %     Weight 02/12/16 0834 150 lb (68.04 kg)     Height 02/12/16 0834  (1.676 m)     Head Cir --      Peak Flow --      Pain Score --      Pain Loc --      Pain Edu? --      Excl. in GC? --     Constitutional: Alert and oriented. Well appearing and in no acute distress. Eyes: Conjunctivae are normal. PERRL. EOMI. no photophobia Nose: No congestion/rhinnorhea. Mouth/Throat: Mucous membranes are moist.  Oropharynx non-erythematous. Neck: No stridor.  Full range of  motion nontender Cardiovascular: Normal rate, regular rhythm. Grossly normal heart sounds.  Good peripheral circulation. Respiratory: Normal respiratory effort.  No retractions. Lungs CTAB with decreased breath sounds.. Musculoskeletal: No lower extremity tenderness nor edema.  No joint effusions. Neurologic:  Normal speech and language. No gross focal neurologic deficits are appreciated. No gait instability. Skin:  Skin is warm, dry and intact. No rash noted. Psychiatric: Mood and affect are normal. Speech and behavior are normal.  ____________________________________________   LABS (all labs ordered are listed, but only abnormal results are displayed)  Labs Reviewed  BASIC METABOLIC PANEL - Abnormal; Notable for the following:    Sodium 134 (*)     Potassium 3.2 (*)    Glucose, Bld 105 (*)    Calcium 8.6 (*)    Anion gap 4 (*)    All other components within normal limits  RAPID INFLUENZA A&B ANTIGENS ([facility] ONLY)  CBC WITH DIFFERENTIAL/PLATELET   ____________________________________________   RADIOLOGY  No acute cardiopulmonary findings ____________________________________________   PROCEDURES  Procedure(s) performed: None  Critical Care performed: No  ____________________________________________   INITIAL IMPRESSION / ASSESSMENT AND PLAN / ED COURSE  Pertinent labs & imaging results that were available during my care of the patient were reviewed by me and considered in my medical decision making (see chart for details).  Acute upper respiratory infection. Negative flu swab. Patient received 1 L IV fluids feeling better upon discharge Rx given for Zithromax, chlorpheniramine, Robitussin-AC and Sudafed. Patient to follow-up with her PCP or return to the ER with any worsening symptomology. She voices no other emergency medical complaints at this time. ____________________________________________   FINAL CLINICAL IMPRESSION(S) / ED DIAGNOSES  Final diagnoses:  URI, acute     This chart was dictated using voice recognition software/Dragon. Despite best efforts to proofread, errors can occur which can change the meaning. Any change was purely unintentional.   Evangeline Dakinharles M Constanza Mincy, PA-C 02/12/16 1132  Myrna Blazeravid Matthew Schaevitz, MD 02/12/16 [phone]

## 2016-04-07 ENCOUNTER — Emergency Department
Admission: EM | Admit: 2016-04-07 | Discharge: 2016-04-07 | Disposition: A | Discharge status: Home / Self Care | Payer: Self-pay | Attending: Emergency Medicine | Admitting: Emergency Medicine

## 2016-04-07 ENCOUNTER — Emergency Department: Payer: Self-pay

## 2016-04-07 ENCOUNTER — Encounter: Payer: Self-pay | Admitting: *Deleted

## 2016-04-07 DIAGNOSIS — J069 Acute upper respiratory infection, unspecified: Secondary | ICD-10-CM | POA: Insufficient documentation

## 2016-04-07 DIAGNOSIS — F1290 Cannabis use, unspecified, uncomplicated: Secondary | ICD-10-CM | POA: Insufficient documentation

## 2016-04-07 DIAGNOSIS — F17210 Nicotine dependence, cigarettes, uncomplicated: Secondary | ICD-10-CM | POA: Insufficient documentation

## 2016-04-07 DIAGNOSIS — J9801 Acute bronchospasm: Secondary | ICD-10-CM | POA: Insufficient documentation

## 2016-04-07 DIAGNOSIS — R1110 Vomiting, unspecified: Secondary | ICD-10-CM | POA: Insufficient documentation

## 2016-04-07 LAB — URINALYSIS COMPLETE WITH MICROSCOPIC (ARMC ONLY)
BACTERIA UA: NONE SEEN
Bilirubin Urine: NEGATIVE
Glucose, UA: NEGATIVE mg/dL
LEUKOCYTES UA: NEGATIVE
NITRITE: NEGATIVE
PH: 7 (ref 5.0–8.0)
PROTEIN: 30 mg/dL — AB
SPECIFIC GRAVITY, URINE: 1.017 (ref 1.005–1.030)

## 2016-04-07 LAB — COMPREHENSIVE METABOLIC PANEL
ALBUMIN: 4.3 g/dL (ref 3.5–5.0)
ALT: 15 U/L (ref 14–54)
AST: 21 U/L (ref 15–41)
Alkaline Phosphatase: 46 U/L (ref 38–126)
Anion gap: 8 (ref 5–15)
BILIRUBIN TOTAL: 0.9 mg/dL (ref 0.3–1.2)
BUN: 12 mg/dL (ref 6–20)
CO2: 21 mmol/L — AB (ref 22–32)
Calcium: 8.8 mg/dL — ABNORMAL LOW (ref 8.9–10.3)
Chloride: 109 mmol/L (ref 101–111)
Creatinine, Ser: 0.66 mg/dL (ref 0.44–1.00)
GFR calc Af Amer: >60 mL/min (ref >60)
GFR calc non Af Amer: >60 mL/min (ref >60)
GLUCOSE: 121 mg/dL — AB (ref 65–99)
POTASSIUM: 3.7 mmol/L (ref 3.5–5.1)
Sodium: 138 mmol/L (ref 135–145)
TOTAL PROTEIN: 7.5 g/dL (ref 6.5–8.1)

## 2016-04-07 LAB — CBC
HEMATOCRIT: 38.2 % (ref 35.0–47.0)
Hemoglobin: 13.1 g/dL (ref 12.0–16.0)
MCH: 29.7 pg (ref 26.0–34.0)
MCHC: 34.3 g/dL (ref 32.0–36.0)
MCV: 86.7 fL (ref 80.0–100.0)
Platelets: 260 10*3/uL (ref 150–440)
RBC: 4.4 MIL/uL (ref 3.80–5.20)
RDW: 12.9 % (ref 11.5–14.5)
WBC: 16.3 10*3/uL — ABNORMAL HIGH (ref 3.6–11.0)

## 2016-04-07 LAB — LIPASE, BLOOD: Lipase: 18 U/L (ref 11–51)

## 2016-04-07 MED ORDER — PREDNISONE 50 MG PO TABS: 50.0000 mg | ORAL_TABLET | Freq: Every day | ORAL | Status: DC

## 2016-04-07 MED ORDER — ONDANSETRON 4 MG PO TBDP
4.0000 mg | ORAL_TABLET | Freq: Once | ORAL | Status: AC | PRN
  Administered 2016-04-07: 4 mg via ORAL
  Filled 2016-04-07: qty 1

## 2016-04-07 MED ORDER — IPRATROPIUM-ALBUTEROL 0.5-2.5 (3) MG/3ML IN SOLN
RESPIRATORY_TRACT | Status: AC
  Administered 2016-04-07: 6 mL via RESPIRATORY_TRACT
  Filled 2016-04-07: qty 6

## 2016-04-07 MED ORDER — ALBUTEROL SULFATE HFA 108 (90 BASE) MCG/ACT IN AERS: 2.0000 | INHALATION_SPRAY | Freq: Four times a day (QID) | RESPIRATORY_TRACT | Status: DC | PRN

## 2016-04-07 MED ORDER — SODIUM CHLORIDE 0.9 % IV SOLN
Freq: Once | INTRAVENOUS | Status: AC
  Administered 2016-04-07: 11:00:00 via INTRAVENOUS

## 2016-04-07 MED ORDER — IPRATROPIUM-ALBUTEROL 0.5-2.5 (3) MG/3ML IN SOLN
6.0000 mL | Freq: Once | RESPIRATORY_TRACT | Status: AC
  Administered 2016-04-07: 6 mL via RESPIRATORY_TRACT

## 2016-04-07 NOTE — ED Notes (Signed)
Pt reports having a sore throat starting on  Friday, pt reports vomiting several times last night and being unable to keep any PO fluids down

## 2016-04-07 NOTE — Discharge Instructions (Signed)
Bronchospasm, Adult  A bronchospasm is a spasm or tightening of the airways going into the lungs. During a bronchospasm breathing becomes more difficult because the airways get smaller. When this happens there can be coughing, a whistling sound when breathing (wheezing), and difficulty breathing. Bronchospasm is often associated with asthma, but not all patients who experience a bronchospasm have asthma.  CAUSES   A bronchospasm is caused by inflammation or irritation of the airways. The inflammation or irritation may be triggered by:   · Allergies (such as to animals, pollen, food, or mold). Allergens that cause bronchospasm may cause wheezing immediately after exposure or many hours later.    · Infection. Viral infections are believed to be the most common cause of bronchospasm.    · Exercise.    · Irritants (such as pollution, cigarette smoke, strong odors, aerosol sprays, and paint fumes).    · Weather changes. Winds increase molds and pollens in the air. Rain refreshes the air by washing irritants out. Cold air may cause inflammation.    · Stress and emotional upset.    SIGNS AND SYMPTOMS   · Wheezing.    · Excessive nighttime coughing.    · Frequent or severe coughing with a simple cold.    · Chest tightness.    · Shortness of breath.    DIAGNOSIS   Bronchospasm is usually diagnosed through a history and physical exam. Tests, such as chest X-rays, are sometimes done to look for other conditions.  TREATMENT   · Inhaled medicines can be given to open up your airways and help you breathe. The medicines can be given using either an inhaler or a nebulizer machine.  · Corticosteroid medicines may be given for severe bronchospasm, usually when it is associated with asthma.  HOME CARE INSTRUCTIONS   · Always have a plan prepared for seeking medical care. Know when to call your health care provider and local emergency services (911 in the [place] Know where you can access local emergency care.  · Only take medicines as  directed by your health care provider.  · If you were prescribed an inhaler or nebulizer machine, ask your health care provider to explain how to use it correctly. Always use a spacer with your inhaler if you were given one.  · It is necessary to remain calm during an attack. Try to relax and breathe more slowly.   · Control your home environment in the following ways:      Change your heating and air conditioning filter at least once a month.      Limit your use of fireplaces and wood stoves.    Do not smoke and do not allow smoking in your home.      Avoid exposure to perfumes and fragrances.      Get rid of pests (such as roaches and mice) and their droppings.      Throw away plants if you see mold on them.      Keep your house clean and dust free.      Replace carpet with wood, tile, or vinyl flooring. Carpet can trap dander and dust.      Use allergy-proof pillows, mattress covers, and box spring covers.      Wash bed sheets and blankets every week in hot water and dry them in a dryer.      Use blankets that are made of polyester or cotton.      Wash hands frequently.  SEEK MEDICAL CARE IF:   · You have muscle aches.    · You have chest pain.    · The sputum changes from clear or   white to yellow, green, gray, or bloody.    · The sputum you cough up gets thicker.    · There are problems that may be related to the medicine you are given, such as a rash, itching, swelling, or trouble breathing.    SEEK IMMEDIATE MEDICAL CARE IF:   · You have worsening wheezing and coughing even after taking your prescribed medicines.    · You have increased difficulty breathing.    · You develop severe chest pain.  MAKE SURE YOU:   · Understand these instructions.  · Will watch your condition.  · Will get help right away if you are not doing well or get worse.     This information is not intended to replace advice given to you by your health care provider. Make sure you discuss any questions you have with your health care  provider.     Document Released: 11/13/2003 Document Revised: 12/01/2014 Document Reviewed: 05/02/2013  Elsevier Interactive Patient Education ©2016 [employer]

## 2016-04-07 NOTE — ED Provider Notes (Signed)
[facility] Emergency Department Provider Note  ____________________________________________    I have reviewed the triage vital signs and the nursing notes.   HISTORY  Chief Complaint Sore Throat and Emesis    HPI Joslyn Devoniesha Kean is a 30 y.o. female who presents with complaints of sore throat, cough and posttussive emesis that started 2 days ago. Patient reports her sore throat has improved but she continues to have a mild cough. She reports she has always vomited after coughing for most of her life. She denies fevers but does report chills. No recent travel. No Pain or swelling. No shortness of breath. He also reports nasal congestion. She does admit to smoking cigarettes.     History reviewed. No pertinent past medical history.  There are no active problems to display for this patient.   History reviewed. No pertinent past surgical history.  Current Outpatient Rx  Name  Route  Sig  Dispense  Refill  . azithromycin (ZITHROMAX Z-PAK) 250 MG tablet      Take 2 tablets (500 mg) on  Day 1,  followed by 1 tablet (250 mg) once daily on Days 2 through 5.   6 each   0   . chlorpheniramine (CHLOR-TRIMETON) 4 MG tablet   Oral   Take 1 tablet (4 mg total) by mouth 2 (two) times daily as needed for allergies or rhinitis.   30 tablet   0   . guaiFENesin-codeine 100-10 MG/5ML syrup   Oral   Take 10 mLs by mouth every 4 (four) hours as needed for cough.   180 mL   0   . pseudoephedrine (SUDAFED) 60 MG tablet   Oral   Take 1 tablet (60 mg total) by mouth every 4 (four) hours as needed for congestion.   24 tablet   0     Allergies Shellfish allergy  No family history on file.  Social History Social History  Substance Use Topics  . Smoking status: Current Every Day Smoker -- 0.50 packs/day for 10 years    Types: Cigarettes  . Smokeless tobacco: Never Used  . Alcohol Use: Yes     Comment: occasionally    Review of Systems  Constitutional:  Negative for fever. Eyes: Negative for redness ENT: As above Cardiovascular: Negative for chest pain Respiratory: Positive for cough Gastrointestinal: Negative for abdominal pain Genitourinary: Negative for dysuria. Musculoskeletal: Negative for back pain. Skin: Negative for rash. Neurological: Positive for headache Psychiatric: no anxiety    ____________________________________________   PHYSICAL EXAM:  VITAL SIGNS: ED Triage Vitals  Enc Vitals Group     BP 04/07/16 1043 121/65 mmHg     Pulse Rate 04/07/16 1043 93     Resp 04/07/16 1043 20     Temp 04/07/16 1043 98.4 F (36.9 C)     Temp Source 04/07/16 1043 Oral     SpO2 04/07/16 1043 99 %     Weight 04/07/16 1043 160 lb (72.576 kg)     Height 04/07/16 1043 5\' 7"  (1.702 m)     Head Cir --      Peak Flow --      Pain Score 04/07/16 1044 3     Pain Loc --      Pain Edu? --      Excl. in GC? --      Constitutional: Alert and oriented. Well appearing and in no distress.  Eyes: Conjunctivae are normal. No erythema or injection ENT   Head: Normocephalic and atraumatic.  Mouth/Throat: Mucous membranes are moist.Pharynx is normal Cardiovascular: Normal rate, regular rhythm. Normal and symmetric distal pulses are present in the upper extremities. Respiratory: Normal respiratory effort without tachypnea nor retractions. Patient with scattered wheezes Gastrointestinal: Soft and non-tender in all quadrants. No distention. There is no CVA tenderness. Genitourinary: deferred Musculoskeletal: Nontender with normal range of motion in all extremities. . Neurologic:  Normal speech and language. No gross focal neurologic deficits are appreciated. Skin:  Skin is warm, dry and intact. No rash noted. Psychiatric: Mood and affect are normal. Patient exhibits appropriate insight and judgment.  ____________________________________________    LABS (pertinent positives/negatives)  Labs Reviewed  COMPREHENSIVE METABOLIC PANEL  - Abnormal; Notable for the following:    CO2 21 (*)    Glucose, Bld 121 (*)    Calcium 8.8 (*)    All other components within normal limits  CBC - Abnormal; Notable for the following:    WBC 16.3 (*)    All other components within normal limits  LIPASE, BLOOD  URINALYSIS COMPLETEWITH MICROSCOPIC ([facility] ONLY)    ____________________________________________   EKG  None  ____________________________________________    RADIOLOGY  Chest x-ray unremarkable  ____________________________________________   PROCEDURES  Procedure(s) performed: none  Critical Care performed: none  ____________________________________________   INITIAL IMPRESSION / ASSESSMENT AND PLAN / ED COURSE  Pertinent labs & imaging results that were available during my care of the patient were reviewed by me and considered in my medical decision making (see chart for details).  Patient presents with complaints of cough, sore throat and congestion. She has scattered wheezes on exam. She does smoke cigarettes. Suspect element of bronchospasm. We'll treat with DuoNeb, check chest x-ray and reevaluate.  I counseled the patient on smoking cessation extensively. Chest x-ray is unremarkable. All vitals are normal. Patient has elevated white blood cell count of unknown significance. Patient is nontoxic in appearance and symptoms are most consistent with upper respiratory infection. Recommend supportive care.  ____________________________________________   FINAL CLINICAL IMPRESSION(S) / ED DIAGNOSES  Final diagnoses:  Upper respiratory infection  Bronchospasm, acute          Jene Every, MD 04/07/16 1441

## 2016-07-15 ENCOUNTER — Emergency Department
Admission: EM | Admit: 2016-07-15 | Discharge: 2016-07-15 | Disposition: A | Discharge status: Home / Self Care | Payer: Self-pay | Attending: Emergency Medicine | Admitting: Emergency Medicine

## 2016-07-15 ENCOUNTER — Encounter: Payer: Self-pay | Admitting: Medical Oncology

## 2016-07-15 DIAGNOSIS — R102 Pelvic and perineal pain: Secondary | ICD-10-CM

## 2016-07-15 DIAGNOSIS — Z79899 Other long term (current) drug therapy: Secondary | ICD-10-CM | POA: Insufficient documentation

## 2016-07-15 DIAGNOSIS — F17210 Nicotine dependence, cigarettes, uncomplicated: Secondary | ICD-10-CM | POA: Insufficient documentation

## 2016-07-15 DIAGNOSIS — Z7951 Long term (current) use of inhaled steroids: Secondary | ICD-10-CM | POA: Insufficient documentation

## 2016-07-15 DIAGNOSIS — N898 Other specified noninflammatory disorders of vagina: Secondary | ICD-10-CM | POA: Insufficient documentation

## 2016-07-15 HISTORY — DX: Polycystic ovarian syndrome: E28.2

## 2016-07-15 LAB — URINALYSIS COMPLETE WITH MICROSCOPIC (ARMC ONLY)
Bacteria, UA: NONE SEEN
Bilirubin Urine: NEGATIVE
Glucose, UA: NEGATIVE mg/dL
Hgb urine dipstick: NEGATIVE
KETONES UR: NEGATIVE mg/dL
LEUKOCYTES UA: NEGATIVE
NITRITE: NEGATIVE
PH: 7 (ref 5.0–8.0)
Protein, ur: NEGATIVE mg/dL
Specific Gravity, Urine: 1.018 (ref 1.005–1.030)

## 2016-07-15 LAB — CBC
HCT: 38.9 % (ref 35.0–47.0)
Hemoglobin: 13.5 g/dL (ref 12.0–16.0)
MCH: 30.4 pg (ref 26.0–34.0)
MCHC: 34.6 g/dL (ref 32.0–36.0)
MCV: 87.8 fL (ref 80.0–100.0)
PLATELETS: 249 10*3/uL (ref 150–440)
RBC: 4.43 MIL/uL (ref 3.80–5.20)
RDW: 13 % (ref 11.5–14.5)
WBC: 8.9 10*3/uL (ref 3.6–11.0)

## 2016-07-15 LAB — COMPREHENSIVE METABOLIC PANEL
ALBUMIN: 4 g/dL (ref 3.5–5.0)
ALK PHOS: 47 U/L (ref 38–126)
ALT: 12 U/L — AB (ref 14–54)
ANION GAP: 6 (ref 5–15)
AST: 18 U/L (ref 15–41)
BILIRUBIN TOTAL: 1 mg/dL (ref 0.3–1.2)
BUN: 11 mg/dL (ref 6–20)
CHLORIDE: 107 mmol/L (ref 101–111)
CO2: 21 mmol/L — AB (ref 22–32)
Calcium: 8.5 mg/dL — ABNORMAL LOW (ref 8.9–10.3)
Creatinine, Ser: 0.61 mg/dL (ref 0.44–1.00)
GFR calc non Af Amer: >60 mL/min (ref >60)
GLUCOSE: 99 mg/dL (ref 65–99)
Potassium: 3.9 mmol/L (ref 3.5–5.1)
Sodium: 134 mmol/L — ABNORMAL LOW (ref 135–145)
Total Protein: 7 g/dL (ref 6.5–8.1)

## 2016-07-15 LAB — WET PREP, GENITAL
CLUE CELLS WET PREP: NONE SEEN
Sperm: NONE SEEN
TRICH WET PREP: NONE SEEN
YEAST WET PREP: NONE SEEN

## 2016-07-15 LAB — PREGNANCY, URINE: Preg Test, Ur: NEGATIVE

## 2016-07-15 LAB — CHLAMYDIA/NGC RT PCR (ARMC ONLY)
CHLAMYDIA TR: NOT DETECTED
N GONORRHOEAE: NOT DETECTED

## 2016-07-15 LAB — LIPASE, BLOOD: Lipase: 23 U/L (ref 11–51)

## 2016-07-15 MED ORDER — IBUPROFEN 800 MG PO TABS: 800.0000 mg | ORAL_TABLET | Freq: Three times a day (TID) | ORAL | 0 refills | Status: DC | PRN

## 2016-07-15 MED ORDER — AZITHROMYCIN 500 MG PO TABS
1000.0000 mg | ORAL_TABLET | Freq: Once | ORAL | Status: AC
  Administered 2016-07-15: 1000 mg via ORAL
  Filled 2016-07-15: qty 2

## 2016-07-15 MED ORDER — CEFTRIAXONE SODIUM 250 MG IJ SOLR
250.0000 mg | INTRAMUSCULAR | Status: DC
Start: 2016-07-15 — End: 2016-07-15
  Administered 2016-07-15: 250 mg via INTRAMUSCULAR
  Filled 2016-07-15: qty 250

## 2016-07-15 NOTE — ED Provider Notes (Signed)
[facility] Emergency Department Provider Note        Time seen: ----------------------------------------- 12:56 PM on 07/15/2016 -----------------------------------------    I have reviewed the triage vital signs and the nursing notes.   HISTORY  Chief Complaint Nausea and Abdominal Cramping    HPI Kathleen Hardy is a 30 y.o. female who presents the ER for nausea since Saturday with lower abdominal cramping.Patient states she's not had any fever or chills, denies diarrhea or other vaginal complaints. Nothing has made her symptoms better.   Past Medical History:  Diagnosis Date  . PCOS (polycystic ovarian syndrome)     There are no active problems to display for this patient.   History reviewed. No pertinent surgical history.  Allergies Shellfish allergy  Social History Social History  Substance Use Topics  . Smoking status: Current Every Day Smoker    Packs/day: 0.50    Years: 10.00    Types: Cigarettes  . Smokeless tobacco: Never Used  . Alcohol use Yes     Comment: occasionally    Review of Systems Constitutional: Negative for fever. Cardiovascular: Negative for chest pain. Respiratory: Negative for shortness of breath. Gastrointestinal: Negative for abdominal pain, Positive for nausea Genitourinary: Negative for dysuria.Positive for pelvic pain Musculoskeletal: Negative for back pain. Skin: Negative for rash. Neurological: Negative for headaches, focal weakness or numbness.  10-point ROS otherwise negative.  ____________________________________________   PHYSICAL EXAM:  VITAL SIGNS: ED Triage Vitals [07/15/16 1005]  Enc Vitals Group     BP 121/70     Pulse Rate 69     Resp 16     Temp 97.9 F (36.6 C)     Temp Source Oral     SpO2 100 %     Weight 160 lb (72.6 kg)     Height 5\' 7"  (1.702 m)     Head Circumference      Peak Flow      Pain Score 6     Pain Loc      Pain Edu?      Excl. in GC?      Constitutional: Alert and oriented. Well appearing and in no distress. Eyes: Conjunctivae are normal. PERRL. Normal extraocular movements. ENT   Head: Normocephalic and atraumatic.   Nose: No congestion/rhinnorhea.   Mouth/Throat: Mucous membranes are moist.   Neck: No stridor. Cardiovascular: Normal rate, regular rhythm. No murmurs, rubs, or gallops. Respiratory: Normal respiratory effort without tachypnea nor retractions. Breath sounds are clear and equal bilaterally. No wheezes/rales/rhonchi. Gastrointestinal: Soft and nontender. Normal bowel sounds Genitourinary: Copious thick white vaginal discharge, cervical motion tenderness, no adnexal tenderness. Musculoskeletal: Nontender with normal range of motion in all extremities. No lower extremity tenderness nor edema. Neurologic:  Normal speech and language. No gross focal neurologic deficits are appreciated.  Skin:  Skin is warm, dry and intact. No rash noted. Psychiatric: Mood and affect are normal. Speech and behavior are normal.  ____________________________________________  ED COURSE:  Pertinent labs & imaging results that were available during my care of the patient were reviewed by me and considered in my medical decision making (see chart for details). Clinical Course  Patient is in no distress, will check basic labs and reevaluate.  Procedures ____________________________________________   LABS (pertinent positives/negatives)  Labs Reviewed  WET PREP, GENITAL - Abnormal; Notable for the following:       Result Value   WBC, Wet Prep HPF POC FEW (*)    All other components within normal limits  COMPREHENSIVE  METABOLIC PANEL - Abnormal; Notable for the following:    Sodium 134 (*)    CO2 21 (*)    Calcium 8.5 (*)    ALT 12 (*)    All other components within normal limits  URINALYSIS COMPLETEWITH MICROSCOPIC ([facility] ONLY) - Abnormal; Notable for the following:    Color, Urine AMBER (*)    APPearance  CLEAR (*)    Squamous Epithelial / LPF 6-30 (*)    All other components within normal limits  CHLAMYDIA/NGC RT PCR ([facility] ONLY)  LIPASE, BLOOD  CBC  PREGNANCY, URINE  POC URINE PREG, ED   ____________________________________________  FINAL ASSESSMENT AND PLAN  Pelvic pain, Vaginal discharge  Plan: Patient with labs and imaging as dictated above. No clear etiology for her symptoms, I will cover her with Rocephin and Zithromax for STD. I will prescribe Motrin for her symptoms, she is referred to GYN for follow-up.   Emily FilbertWilliams, Jonathan E, MD   Note: This dictation was prepared with Dragon dictation. Any transcriptional errors that result from this process are unintentional    Emily FilbertJonathan E Williams, MD 07/15/16 1350

## 2016-07-15 NOTE — ED Triage Notes (Signed)
Pt reports nausea since Saturday with lower abd cramping.

## 2016-10-21 ENCOUNTER — Emergency Department: Payer: Self-pay

## 2016-10-21 ENCOUNTER — Emergency Department
Admission: EM | Admit: 2016-10-21 | Discharge: 2016-10-21 | Disposition: A | Discharge status: Home / Self Care | Payer: Self-pay | Attending: Emergency Medicine | Admitting: Emergency Medicine

## 2016-10-21 ENCOUNTER — Encounter: Payer: Self-pay | Admitting: Emergency Medicine

## 2016-10-21 DIAGNOSIS — F17210 Nicotine dependence, cigarettes, uncomplicated: Secondary | ICD-10-CM | POA: Insufficient documentation

## 2016-10-21 DIAGNOSIS — R102 Pelvic and perineal pain: Secondary | ICD-10-CM

## 2016-10-21 DIAGNOSIS — N938 Other specified abnormal uterine and vaginal bleeding: Secondary | ICD-10-CM | POA: Insufficient documentation

## 2016-10-21 LAB — CHLAMYDIA/NGC RT PCR (ARMC ONLY)
CHLAMYDIA TR: NOT DETECTED
N GONORRHOEAE: NOT DETECTED

## 2016-10-21 LAB — URINALYSIS COMPLETE WITH MICROSCOPIC (ARMC ONLY)
BILIRUBIN URINE: NEGATIVE
Bacteria, UA: NONE SEEN
Glucose, UA: NEGATIVE mg/dL
KETONES UR: NEGATIVE mg/dL
NITRITE: NEGATIVE
PH: 5 (ref 5.0–8.0)
PROTEIN: NEGATIVE mg/dL
SPECIFIC GRAVITY, URINE: 1.017 (ref 1.005–1.030)

## 2016-10-21 LAB — WET PREP, GENITAL
Clue Cells Wet Prep HPF POC: NONE SEEN
Sperm: NONE SEEN
Trich, Wet Prep: NONE SEEN
Yeast Wet Prep HPF POC: NONE SEEN

## 2016-10-21 LAB — POCT PREGNANCY, URINE: Preg Test, Ur: NEGATIVE

## 2016-10-21 MED ORDER — OXYCODONE HCL 5 MG PO TABS
5.0000 mg | ORAL_TABLET | Freq: Once | ORAL | Status: AC
  Administered 2016-10-21: 5 mg via ORAL
  Filled 2016-10-21: qty 1

## 2016-10-21 NOTE — ED Triage Notes (Signed)
Patient presents to the ED with pelvic cramping.  Patient reports today is the first day of her menstrual cycle.  Patient states, "It's like my normal cramping but it's worse."  Patient has history of PCOS.  Patient states today is the correct time for her period.  Patient is in no obvious distress at this time.  Patient reports taking extra strength tylenol.

## 2016-10-21 NOTE — ED Notes (Signed)
Pt in via triage with complaints of severe menstrual cramps since 0400 today.  Pt reports starting menstrual cycle today, denies any abnormal bleeding or discharge, states cramps are not normally this bad.  Pt reports taking 6 500mg  tylenol over the course of the day with no relief.  Pt A/Ox4, ambulatory into room, no immediate distress at this time.

## 2016-10-21 NOTE — ED Provider Notes (Signed)
[facility] Emergency Department Provider Note  ____________________________________________   First MD Initiated Contact with Patient 10/21/16 1413     (approximate)  I have reviewed the triage vital signs and the nursing notes.   HISTORY  Chief Complaint Abdominal Cramping   HPI Kathleen Hardy is a 30 y.o. female with a history of polycystic ovarian syndrome who is presenting to the emergency department with bleeding and pelvic pain since 4 AM this morning. She says that this is the right eye with a month for her. She is having slightly heavier bleeding than normal. She says that she has used 3 tampons already today. Denies any blood clots. Says the pain is an 8 out of 10 and sharp to the lower abdomen and pelvis. Denies any nausea vomiting or diarrhea. Eyes no vaginal discharge. Says that several years ago she was on birth control to help with her painful periods but since then she has had increasing pain with her periods. She has been seen previously at [place] [facility] for her OB/GYN pathologies.   Past Medical History:  Diagnosis Date  . PCOS (polycystic ovarian syndrome)     There are no active problems to display for this patient.   History reviewed. No pertinent surgical history.  Prior to Admission medications   Medication Sig Start Date End Date Taking? Authorizing Provider  albuterol (PROVENTIL HFA;VENTOLIN HFA) 108 (90 Base) MCG/ACT inhaler Inhale 2 puffs into the lungs every 6 (six) hours as needed for wheezing or shortness of breath. 04/07/16   Jene Every, MD  azithromycin (ZITHROMAX Z-PAK) 250 MG tablet Take 2 tablets (500 mg) on  Day 1,  followed by 1 tablet (250 mg) once daily on Days 2 through 5. 02/12/16   Evangeline Dakin, PA-C  chlorpheniramine (CHLOR-TRIMETON) 4 MG tablet Take 1 tablet (4 mg total) by mouth 2 (two) times daily as needed for allergies or rhinitis. 02/12/16   Charmayne Sheer Beers, PA-C  guaiFENesin-codeine 100-10 MG/5ML syrup Take  10 mLs by mouth every 4 (four) hours as needed for cough. 02/12/16   Charmayne Sheer Beers, PA-C  ibuprofen (ADVIL,MOTRIN) 800 MG tablet Take 1 tablet (800 mg total) by mouth every 8 (eight) hours as needed. 07/15/16   Emily Filbert, MD  predniSONE (DELTASONE) 50 MG tablet Take 1 tablet (50 mg total) by mouth daily with breakfast. 04/07/16   Jene Every, MD  pseudoephedrine (SUDAFED) 60 MG tablet Take 1 tablet (60 mg total) by mouth every 4 (four) hours as needed for congestion. 02/12/16   Evangeline Dakin, PA-C    Allergies Shellfish allergy  No family history on file.  Social History Social History  Substance Use Topics  . Smoking status: Current Every Day Smoker    Packs/day: 0.50    Years: 10.00    Types: Cigarettes  . Smokeless tobacco: Never Used  . Alcohol use Yes     Comment: occasionally    Review of Systems Constitutional: No fever/chills Eyes: No visual changes. ENT: No sore throat. Cardiovascular: Denies chest pain. Respiratory: Denies shortness of breath. Gastrointestinal:  No nausea, no vomiting.  No diarrhea.  No constipation. Genitourinary: Negative for dysuria. Musculoskeletal: Negative for back pain. Skin: Negative for rash. Neurological: Negative for headaches, focal weakness or numbness.  10-point ROS otherwise negative.  ____________________________________________   PHYSICAL EXAM:  VITAL SIGNS: ED Triage Vitals  Enc Vitals Group     BP 10/21/16 1331 121/75     Pulse Rate 10/21/16 1331 72  Resp 10/21/16 1331 18     Temp 10/21/16 1331 97.4 F (36.3 C)     Temp Source 10/21/16 1331 Oral     SpO2 10/21/16 1331 99 %     Weight 10/21/16 1332 165 lb (74.8 kg)     Height 10/21/16 1332 5\' 7"  (1.702 m)     Head Circumference --      Peak Flow --      Pain Score 10/21/16 1332 8     Pain Loc --      Pain Edu? --      Excl. in GC? --     Constitutional: Alert and oriented. Well appearing and in no acute distress. Eyes: Conjunctivae are normal.  PERRL. EOMI. Head: Atraumatic. Nose: No congestion/rhinnorhea. Mouth/Throat: Mucous membranes are moist.   Neck: No stridor.   Cardiovascular: Normal rate, regular rhythm. Grossly normal heart sounds.  Respiratory: Normal respiratory effort.  No retractions. Lungs CTAB. Gastrointestinal: Soft mild to moderate tenderness across the lower abdomen. No distention.  Genitourinary:  Normal external exam. Speculum exam with small amount of pooling of blood. Removed with a Q-tip and there is no reaccumulation. No active bleeding visualized from the cervix. Bimanual exam with a closed cervical os. No CMT. Mild to moderate uterine as well as bimanual adnexal tenderness without masses on bimanual exam. Musculoskeletal: No lower extremity tenderness nor edema.  No joint effusions. Neurologic:  Normal speech and language. No gross focal neurologic deficits are appreciated. No gait instability. Skin:  Skin is warm, dry and intact. No rash noted. Psychiatric: Mood and affect are normal. Speech and behavior are normal.  ____________________________________________   LABS (all labs ordered are listed, but only abnormal results are displayed)  Labs Reviewed  WET PREP, GENITAL - Abnormal; Notable for the following:       Result Value   WBC, Wet Prep HPF POC FEW (*)    All other components within normal limits  URINALYSIS COMPLETEWITH MICROSCOPIC ([facility] ONLY) - Abnormal; Notable for the following:    Color, Urine YELLOW (*)    APPearance CLEAR (*)    Hgb urine dipstick 3+ (*)    Leukocytes, UA TRACE (*)    Squamous Epithelial / LPF 0-5 (*)    All other components within normal limits  CHLAMYDIA/NGC RT PCR ([facility] ONLY)  POC URINE PREG, ED  POCT PREGNANCY, URINE   ____________________________________________  EKG   ____________________________________________  RADIOLOGY US Art/Ven Flow Abd Pelv Doppler (Final result)  Result time 10/21/16 16:20:46  Final result by Bretta BangWilliam Woodruff  III, MD (10/21/16 16:20:46)           Narrative:   CLINICAL DATA: Pelvic pain. History of polycystic ovarian syndrome.  EXAM: TRANSABDOMINAL AND TRANSVAGINAL ULTRASOUND OF PELVIS  DOPPLER ULTRASOUND OF OVARIES  TECHNIQUE: Study was performed transabdominally to optimize pelvic field of view evaluation and transvaginally to optimize internal visceral architecture evaluation.  Color and duplex Doppler ultrasound was utilized to evaluate blood flow to the ovaries.  COMPARISON: September 09, 2015  FINDINGS: Uterus  Measurements: 9.3 x 4.4 x 6.5 cm. No fibroids or other mass visualized. Uterus is anteverted.  Endometrium  Thickness: 8 mm. No focal abnormality visualized.  Right ovary  Measurements: 3.6 x 1.6 x 2.9 cm. Normal appearance/no adnexal mass.  Left ovary  Measurements: 4.5 x 2.1 x 3.3 cm. Normal appearance/no adnexal mass. There is a dominant follicle in left ovary measuring 1.8 x 0.8 x 1.3 cm  Pulsed Doppler evaluation of both ovaries demonstrates normal  low-resistance arterial and venous waveforms.  Other findings  No abnormal free fluid.  IMPRESSION: Study within normal limits. Dominant follicle left ovary, a physiologic finding. No other evidence of mass on this study. No inflammatory foci. No free or loculated pelvic fluid. No ovarian torsion on either side.   Electronically Signed By: Bretta Bang III M.D. On: 10/21/2016 16:20            US Pelvis Complete (Final result)  Result time 10/21/16 16:20:46  Final result by Bretta Bang III, MD (10/21/16 16:20:46)           Narrative:   CLINICAL DATA: Pelvic pain. History of polycystic ovarian syndrome.  EXAM: TRANSABDOMINAL AND TRANSVAGINAL ULTRASOUND OF PELVIS  DOPPLER ULTRASOUND OF OVARIES  TECHNIQUE: Study was performed transabdominally to optimize pelvic field of view evaluation and transvaginally to optimize internal visceral architecture  evaluation.  Color and duplex Doppler ultrasound was utilized to evaluate blood flow to the ovaries.  COMPARISON: September 09, 2015  FINDINGS: Uterus  Measurements: 9.3 x 4.4 x 6.5 cm. No fibroids or other mass visualized. Uterus is anteverted.  Endometrium  Thickness: 8 mm. No focal abnormality visualized.  Right ovary  Measurements: 3.6 x 1.6 x 2.9 cm. Normal appearance/no adnexal mass.  Left ovary  Measurements: 4.5 x 2.1 x 3.3 cm. Normal appearance/no adnexal mass. There is a dominant follicle in left ovary measuring 1.8 x 0.8 x 1.3 cm  Pulsed Doppler evaluation of both ovaries demonstrates normal low-resistance arterial and venous waveforms.  Other findings  No abnormal free fluid.  IMPRESSION: Study within normal limits. Dominant follicle left ovary, a physiologic finding. No other evidence of mass on this study. No inflammatory foci. No free or loculated pelvic fluid. No ovarian torsion on either side.   Electronically Signed By: Bretta Bang III M.D. On: 10/21/2016 16:20            US Transvaginal Non-OB (Final result)  Result time 10/21/16 16:20:46  Final result by Bretta Bang III, MD (10/21/16 16:20:46)           Narrative:   CLINICAL DATA: Pelvic pain. History of polycystic ovarian syndrome.  EXAM: TRANSABDOMINAL AND TRANSVAGINAL ULTRASOUND OF PELVIS  DOPPLER ULTRASOUND OF OVARIES  TECHNIQUE: Study was performed transabdominally to optimize pelvic field of view evaluation and transvaginally to optimize internal visceral architecture evaluation.  Color and duplex Doppler ultrasound was utilized to evaluate blood flow to the ovaries.  COMPARISON: September 09, 2015  FINDINGS: Uterus  Measurements: 9.3 x 4.4 x 6.5 cm. No fibroids or other mass visualized. Uterus is anteverted.  Endometrium  Thickness: 8 mm. No focal abnormality visualized.  Right ovary  Measurements: 3.6 x 1.6 x 2.9 cm. Normal  appearance/no adnexal mass.  Left ovary  Measurements: 4.5 x 2.1 x 3.3 cm. Normal appearance/no adnexal mass. There is a dominant follicle in left ovary measuring 1.8 x 0.8 x 1.3 cm  Pulsed Doppler evaluation of both ovaries demonstrates normal low-resistance arterial and venous waveforms.  Other findings  No abnormal free fluid.  IMPRESSION: Study within normal limits. Dominant follicle left ovary, a physiologic finding. No other evidence of mass on this study. No inflammatory foci. No free or loculated pelvic fluid. No ovarian torsion on either side.   Electronically Signed By: Bretta Bang III M.D. On: 10/21/2016 16:20           ____________________________________________   PROCEDURES  Procedure(s) performed:   Procedures  Critical Care performed:   ____________________________________________   INITIAL IMPRESSION / ASSESSMENT  AND PLAN / ED COURSE  Pertinent labs & imaging results that were available during my care of the patient were reviewed by me and considered in my medical decision making (see chart for details).   Clinical Course   ----------------------------------------- 5:46 PM on 10/21/2016 -----------------------------------------  Patient is resting comfortably at this time. Says that her bleeding has decreased. Very reassuring workup including ultrasound. Patient will be discharged to home. She will take ibuprofen and some Tylenol. She'll take Motrin as directed on the over-the-counter instructions. She will follow-up with [facility] the [facility]. We discussed imaging as well as the diagnosis and she is understanding and will comply.   ____________________________________________   FINAL CLINICAL IMPRESSION(S) / ED DIAGNOSES  Dysfunctional uterine bleeding. Pelvic pain.    NEW MEDICATIONS STARTED DURING THIS VISIT:  New Prescriptions   No medications on file     Note:  This document was prepared using Dragon voice  recognition software and may include unintentional dictation errors.    Myrna Blazeravid Matthew Schaevitz, MD 10/21/16 [phone]

## 2017-01-12 ENCOUNTER — Encounter: Payer: Self-pay | Admitting: *Deleted

## 2017-01-12 ENCOUNTER — Emergency Department
Admission: EM | Admit: 2017-01-12 | Discharge: 2017-01-12 | Disposition: A | Discharge status: Home / Self Care | Payer: Self-pay | Attending: Emergency Medicine | Admitting: Emergency Medicine

## 2017-01-12 DIAGNOSIS — J111 Influenza due to unidentified influenza virus with other respiratory manifestations: Secondary | ICD-10-CM | POA: Insufficient documentation

## 2017-01-12 DIAGNOSIS — Z79899 Other long term (current) drug therapy: Secondary | ICD-10-CM | POA: Insufficient documentation

## 2017-01-12 DIAGNOSIS — F17210 Nicotine dependence, cigarettes, uncomplicated: Secondary | ICD-10-CM | POA: Insufficient documentation

## 2017-01-12 DIAGNOSIS — Z791 Long term (current) use of non-steroidal anti-inflammatories (NSAID): Secondary | ICD-10-CM | POA: Insufficient documentation

## 2017-01-12 MED ORDER — SODIUM CHLORIDE 0.9 % IV BOLUS (SEPSIS)
1000.0000 mL | Freq: Once | INTRAVENOUS | Status: AC
  Administered 2017-01-12: 1000 mL via INTRAVENOUS

## 2017-01-12 MED ORDER — ONDANSETRON HCL 4 MG PO TABS: 4.0000 mg | ORAL_TABLET | Freq: Every day | ORAL | 0 refills | Status: DC | PRN

## 2017-01-12 MED ORDER — IPRATROPIUM-ALBUTEROL 0.5-2.5 (3) MG/3ML IN SOLN
3.0000 mL | Freq: Once | RESPIRATORY_TRACT | Status: AC
  Administered 2017-01-12: 3 mL via RESPIRATORY_TRACT
  Filled 2017-01-12: qty 3

## 2017-01-12 MED ORDER — ALBUTEROL SULFATE HFA 108 (90 BASE) MCG/ACT IN AERS: 2.0000 | INHALATION_SPRAY | Freq: Four times a day (QID) | RESPIRATORY_TRACT | 0 refills | Status: AC | PRN

## 2017-01-12 MED ORDER — ONDANSETRON HCL 4 MG/2ML IJ SOLN
4.0000 mg | Freq: Once | INTRAMUSCULAR | Status: AC
  Administered 2017-01-12: 4 mg via INTRAVENOUS
  Filled 2017-01-12: qty 2

## 2017-01-12 NOTE — ED Provider Notes (Signed)
[facility] Emergency Department Provider Note  ____________________________________________  Time seen: Approximately 5:54 PM  I have reviewed the triage vital signs and the nursing notes.   HISTORY  Chief Complaint Fever; Chills; and Sore Throat    HPI Kathleen Hardy is a 31 y.o. female resents the emergency department with 3 days of hot flashes, chills, sore throat, congestion, and nonproductive cough. Patient states that she vomited 3 times last night and has not been able to eat today. Patient states that it is difficult to take deep breaths. Patient states that she is drinking but less than usual. Patient has taken several over-the-counter medications for symptoms, which have not helped. Patient did not receive flu shot this year.  Patient denies chest pain, abdominal pain, diarrhea, constipation.   Past Medical History:  Diagnosis Date  . PCOS (polycystic ovarian syndrome)     There are no active problems to display for this patient.   History reviewed. No pertinent surgical history.  Prior to Admission medications   Medication Sig Start Date End Date Taking? Authorizing Provider  albuterol (PROVENTIL HFA;VENTOLIN HFA) 108 (90 Base) MCG/ACT inhaler Inhale 2 puffs into the lungs every 6 (six) hours as needed for wheezing or shortness of breath. 01/12/17   Enid Derry, PA-C  azithromycin (ZITHROMAX Z-PAK) 250 MG tablet Take 2 tablets (500 mg) on  Day 1,  followed by 1 tablet (250 mg) once daily on Days 2 through 5. 02/12/16   Evangeline Dakin, PA-C  chlorpheniramine (CHLOR-TRIMETON) 4 MG tablet Take 1 tablet (4 mg total) by mouth 2 (two) times daily as needed for allergies or rhinitis. 02/12/16   Charmayne Sheer Beers, PA-C  guaiFENesin-codeine 100-10 MG/5ML syrup Take 10 mLs by mouth every 4 (four) hours as needed for cough. 02/12/16   Charmayne Sheer Beers, PA-C  ibuprofen (ADVIL,MOTRIN) 800 MG tablet Take 1 tablet (800 mg total) by mouth every 8 (eight) hours as  needed. 07/15/16   Emily Filbert, MD  ondansetron (ZOFRAN) 4 MG tablet Take 1 tablet (4 mg total) by mouth daily as needed for nausea or vomiting. 01/12/17 01/12/18  Enid Derry, PA-C  predniSONE (DELTASONE) 50 MG tablet Take 1 tablet (50 mg total) by mouth daily with breakfast. 04/07/16   Jene Every, MD  pseudoephedrine (SUDAFED) 60 MG tablet Take 1 tablet (60 mg total) by mouth every 4 (four) hours as needed for congestion. 02/12/16   Evangeline Dakin, PA-C    Allergies Shellfish allergy  History reviewed. No pertinent family history.  Social History Social History  Substance Use Topics  . Smoking status: Current Every Day Smoker    Packs/day: 0.50    Years: 10.00    Types: Cigarettes  . Smokeless tobacco: Never Used  . Alcohol use Yes     Comment: occasionally     Review of Systems  Cardiovascular: No chest pain. Gastrointestinal: No abdominal pain.  Genitourinary: Negative for dysuria. Musculoskeletal: Negative for musculoskeletal pain. Skin: Negative for rash, abrasions, lacerations, ecchymosis. Neurological: Negative for headaches, numbness or tingling   ____________________________________________   PHYSICAL EXAM:  VITAL SIGNS: ED Triage Vitals  Enc Vitals Group     BP 01/12/17 1519 135/78     Pulse Rate 01/12/17 1519 88     Resp 01/12/17 1519 18     Temp 01/12/17 1519 97.9 F (36.6 C)     Temp Source 01/12/17 1519 Oral     SpO2 01/12/17 1519 100 %     Weight 01/12/17 1518 165  lb (74.8 kg)     Height 01/12/17 1518 5\' 7"  (1.702 m)     Head Circumference --      Peak Flow --      Pain Score 01/12/17 1518 0     Pain Loc --      Pain Edu? --      Excl. in GC? --      Constitutional: Alert and oriented. Well appearing and in no acute distress. Eyes: Conjunctivae are normal. PERRL. EOMI. Head: Atraumatic. ENT:      Ears:      Nose: No congestion/rhinnorhea.      Mouth/Throat: Mucous membranes are moist.  Neck: No stridor.  Cardiovascular:  Normal rate, regular rhythm.  Good peripheral circulation. Respiratory: Normal respiratory effort without tachypnea or retractions. Lungs CTAB. Good air entry to the bases with no decreased or absent breath sounds. Gastrointestinal: Bowel sounds 4 quadrants. Soft and nontender to palpation. No guarding or rigidity. No palpable masses. No distention.  Musculoskeletal: Full range of motion to all extremities. No gross deformities appreciated. Neurologic:  Normal speech and language. No gross focal neurologic deficits are appreciated.  Skin:  Skin is warm, dry and intact. No rash noted. Psychiatric: Mood and affect are normal. Speech and behavior are normal. Patient exhibits appropriate insight and judgement.   ____________________________________________   LABS (all labs ordered are listed, but only abnormal results are displayed)  Labs Reviewed - No data to display ____________________________________________  EKG   ____________________________________________  RADIOLOGY  No results found.  ____________________________________________    PROCEDURES  Procedure(s) performed:    Procedures    Medications  sodium chloride 0.9 % bolus 1,000 mL (0 mLs Intravenous Stopped 01/12/17 1808)  ondansetron (ZOFRAN) injection 4 mg (4 mg Intravenous Given 01/12/17 1641)  ipratropium-albuterol (DUONEB) 0.5-2.5 (3) MG/3ML nebulizer solution 3 mL (3 mLs Nebulization Given 01/12/17 1640)     ____________________________________________   INITIAL IMPRESSION / ASSESSMENT AND PLAN / ED COURSE  Pertinent labs & imaging results that were available during my care of the patient were reviewed by me and considered in my medical decision making (see chart for details).  Review of the [place] CSRS was performed in accordance of the [facility] prior to dispensing any controlled drugs.     Patient's diagnosis is consistent with Influenza. Vital signs and exam are reassuring. Patient received fluids,  Zofran, DuoNeb in ED. Patient states that she feels "amazing "at discharge. Patient states that she is so thankful she came to the ER because she feels so good now. She is outside of the window to begin Tamiflu. Education was provided. All questions were answered. Patient will be discharged home with prescriptions for Zofran and albuterol inhaler. Patient is to follow up with PCP as directed. Patient is given ED precautions to return to the ED for any worsening or new symptoms.     ____________________________________________  FINAL CLINICAL IMPRESSION(S) / ED DIAGNOSES  Final diagnoses:  Influenza      NEW MEDICATIONS STARTED DURING THIS VISIT:  Discharge Medication List as of 01/12/2017  6:21 PM    START taking these medications   Details  ondansetron (ZOFRAN) 4 MG tablet Take 1 tablet (4 mg total) by mouth daily as needed for nausea or vomiting., Starting Mon 01/12/2017, Until Tue 01/12/2018, Print            This chart was dictated using voice recognition software/Dragon. Despite best efforts to proofread, errors can occur which can change the meaning. Any change was purely  unintentional.    Enid Derryshley Rosette Bellavance, PA-C 01/12/17 1906    Merrily BrittleNeil Rifenbark, MD 01/12/17 2003

## 2017-01-12 NOTE — ED Triage Notes (Signed)
States hot flashes, sore throat since Friday

## 2017-06-06 ENCOUNTER — Emergency Department
Admission: EM | Admit: 2017-06-06 | Discharge: 2017-06-06 | Disposition: A | Discharge status: Home / Self Care | Payer: Self-pay | Attending: Emergency Medicine | Admitting: Emergency Medicine

## 2017-06-06 ENCOUNTER — Encounter: Payer: Self-pay | Admitting: Emergency Medicine

## 2017-06-06 DIAGNOSIS — F17210 Nicotine dependence, cigarettes, uncomplicated: Secondary | ICD-10-CM | POA: Insufficient documentation

## 2017-06-06 DIAGNOSIS — R1084 Generalized abdominal pain: Secondary | ICD-10-CM | POA: Insufficient documentation

## 2017-06-06 DIAGNOSIS — R112 Nausea with vomiting, unspecified: Secondary | ICD-10-CM | POA: Insufficient documentation

## 2017-06-06 LAB — CBC
HEMATOCRIT: 38.3 % (ref 35.0–47.0)
HEMOGLOBIN: 13 g/dL (ref 12.0–16.0)
MCH: 29.6 pg (ref 26.0–34.0)
MCHC: 33.9 g/dL (ref 32.0–36.0)
MCV: 87.3 fL (ref 80.0–100.0)
Platelets: 308 10*3/uL (ref 150–440)
RBC: 4.38 MIL/uL (ref 3.80–5.20)
RDW: 13 % (ref 11.5–14.5)
WBC: 10.1 10*3/uL (ref 3.6–11.0)

## 2017-06-06 LAB — COMPREHENSIVE METABOLIC PANEL
ALT: 11 U/L — ABNORMAL LOW (ref 14–54)
ANION GAP: 8 (ref 5–15)
AST: 19 U/L (ref 15–41)
Albumin: 4 g/dL (ref 3.5–5.0)
Alkaline Phosphatase: 47 U/L (ref 38–126)
BILIRUBIN TOTAL: 0.9 mg/dL (ref 0.3–1.2)
BUN: 9 mg/dL (ref 6–20)
CHLORIDE: 107 mmol/L (ref 101–111)
CO2: 23 mmol/L (ref 22–32)
Calcium: 8.6 mg/dL — ABNORMAL LOW (ref 8.9–10.3)
Creatinine, Ser: 0.85 mg/dL (ref 0.44–1.00)
Glucose, Bld: 128 mg/dL — ABNORMAL HIGH (ref 65–99)
Potassium: 3.6 mmol/L (ref 3.5–5.1)
Sodium: 138 mmol/L (ref 135–145)
TOTAL PROTEIN: 7.2 g/dL (ref 6.5–8.1)

## 2017-06-06 LAB — URINALYSIS, COMPLETE (UACMP) WITH MICROSCOPIC
BACTERIA UA: NONE SEEN
BILIRUBIN URINE: NEGATIVE
Glucose, UA: NEGATIVE mg/dL
HGB URINE DIPSTICK: NEGATIVE
KETONES UR: NEGATIVE mg/dL
NITRITE: NEGATIVE
Protein, ur: 30 mg/dL — AB
Specific Gravity, Urine: 1.024 (ref 1.005–1.030)
pH: 7 (ref 5.0–8.0)

## 2017-06-06 LAB — LIPASE, BLOOD: LIPASE: 23 U/L (ref 11–51)

## 2017-06-06 LAB — POCT PREGNANCY, URINE: Preg Test, Ur: NEGATIVE

## 2017-06-06 MED ORDER — SODIUM CHLORIDE 0.9 % IV BOLUS (SEPSIS)
1000.0000 mL | Freq: Once | INTRAVENOUS | Status: AC
  Administered 2017-06-06: 1000 mL via INTRAVENOUS

## 2017-06-06 MED ORDER — ONDANSETRON HCL 4 MG/2ML IJ SOLN
4.0000 mg | Freq: Once | INTRAMUSCULAR | Status: AC
  Administered 2017-06-06: 4 mg via INTRAVENOUS
  Filled 2017-06-06: qty 2

## 2017-06-06 MED ORDER — MORPHINE SULFATE (PF) 4 MG/ML IV SOLN
4.0000 mg | Freq: Once | INTRAVENOUS | Status: AC
  Administered 2017-06-06: 4 mg via INTRAVENOUS
  Filled 2017-06-06: qty 1

## 2017-06-06 MED ORDER — DICYCLOMINE HCL 20 MG PO TABS: 20.0000 mg | ORAL_TABLET | Freq: Three times a day (TID) | ORAL | 0 refills | Status: DC | PRN

## 2017-06-06 MED ORDER — ONDANSETRON HCL 4 MG PO TABS: 4.0000 mg | ORAL_TABLET | Freq: Every day | ORAL | 0 refills | Status: DC | PRN

## 2017-06-06 NOTE — ED Triage Notes (Signed)
Nausea, vomiting and abd pain began last night. Denies diarrhea yet.

## 2017-06-06 NOTE — ED Provider Notes (Signed)
[facility] Emergency Department Provider Note  ____________________________________________   First MD Initiated Contact with Patient 06/06/17 0830     (approximate)  I have reviewed the triage vital signs and the nursing notes.   HISTORY  Chief Complaint Emesis and Abdominal Pain   HPI Kathleen Hardy is a 31 y.o. female with a history of PCO S was presented to emergency department today with vomiting. Says that the vomiting started last night and she has vomited more times than she can count. She says that after vomiting several times she began to develop upper abdominal pain which is now spread diffusely. She says the pain as a 5 out of 10. Denies any diarrhea. Denies any known sick contacts. Says the pain is cramping and nonradiating. She says that the vomitus now looks like a yellow foamy type liquid. Denies any burning with urination, vaginal bleeding or discharge.   Past Medical History:  Diagnosis Date  . PCOS (polycystic ovarian syndrome)     There are no active problems to display for this patient.   History reviewed. No pertinent surgical history.  Prior to Admission medications   Medication Sig Start Date End Date Taking? Authorizing Provider  albuterol (PROVENTIL HFA;VENTOLIN HFA) 108 (90 Base) MCG/ACT inhaler Inhale 2 puffs into the lungs every 6 (six) hours as needed for wheezing or shortness of breath. 01/12/17   Enid DerryWagner, Ashley, PA-C  azithromycin (ZITHROMAX Z-PAK) 250 MG tablet Take 2 tablets (500 mg) on  Day 1,  followed by 1 tablet (250 mg) once daily on Days 2 through 5. 02/12/16   Beers, Charmayne Sheerharles M, PA-C  chlorpheniramine (CHLOR-TRIMETON) 4 MG tablet Take 1 tablet (4 mg total) by mouth 2 (two) times daily as needed for allergies or rhinitis. 02/12/16   Beers, Charmayne Sheerharles M, PA-C  guaiFENesin-codeine 100-10 MG/5ML syrup Take 10 mLs by mouth every 4 (four) hours as needed for cough. 02/12/16   Beers, Charmayne Sheerharles M, PA-C  ibuprofen (ADVIL,MOTRIN) 800  MG tablet Take 1 tablet (800 mg total) by mouth every 8 (eight) hours as needed. 07/15/16   Emily FilbertWilliams, Jonathan E, MD  ondansetron (ZOFRAN) 4 MG tablet Take 1 tablet (4 mg total) by mouth daily as needed for nausea or vomiting. 01/12/17 01/12/18  Enid DerryWagner, Ashley, PA-C  predniSONE (DELTASONE) 50 MG tablet Take 1 tablet (50 mg total) by mouth daily with breakfast. 04/07/16   Jene EveryKinner, Robert, MD  pseudoephedrine (SUDAFED) 60 MG tablet Take 1 tablet (60 mg total) by mouth every 4 (four) hours as needed for congestion. 02/12/16   Beers, Charmayne Sheerharles M, PA-C    Allergies Shellfish allergy  No family history on file.  Social History Social History  Substance Use Topics  . Smoking status: Current Every Day Smoker    Packs/day: 0.50    Years: 10.00    Types: Cigarettes  . Smokeless tobacco: Never Used  . Alcohol use Yes     Comment: occasionally    Review of Systems  Constitutional: No fever/chills Eyes: No visual changes. ENT: No sore throat. Cardiovascular: Denies chest pain. Respiratory: Denies shortness of breath. Gastrointestinal:  No diarrhea.  No constipation. Genitourinary: Negative for dysuria. Musculoskeletal: Negative for back pain. Skin: Negative for rash. Neurological: Negative for headaches, focal weakness or numbness.   ____________________________________________   PHYSICAL EXAM:  VITAL SIGNS: ED Triage Vitals  Enc Vitals Group     BP 06/06/17 0809 (!) 142/71     Pulse Rate 06/06/17 0809 62     Resp 06/06/17 0809 20  Temp 06/06/17 0809 97.8 F (36.6 C)     Temp Source 06/06/17 0809 Oral     SpO2 06/06/17 0809 100 %     Weight 06/06/17 0809 167 lb (75.8 kg)     Height 06/06/17 0809 5\' 6"  (1.676 m)     Head Circumference --      Peak Flow --      Pain Score 06/06/17 0808 5     Pain Loc --      Pain Edu? --      Excl. in GC? --     Constitutional: Alert and oriented. Appears uncomfortable Eyes: Conjunctivae are normal.  Head: Atraumatic. Nose: No  congestion/rhinnorhea. Mouth/Throat: Mucous membranes are moist.  Neck: No stridor.   Cardiovascular: Normal rate, regular rhythm. Grossly normal heart sounds.  Good peripheral circulation. Respiratory: Normal respiratory effort.  No retractions. Lungs CTAB. Gastrointestinal: Soft with mild to moderate tenderness throughout without any rebound or guarding. No distention. No CVA tenderness. Musculoskeletal: No lower extremity tenderness nor edema.  No joint effusions. Neurologic:  Normal speech and language. No gross focal neurologic deficits are appreciated. Skin:  Skin is warm, dry and intact. No rash noted. Psychiatric: Mood and affect are normal. Speech and behavior are normal.  ____________________________________________   LABS (all labs ordered are listed, but only abnormal results are displayed)  Labs Reviewed  COMPREHENSIVE METABOLIC PANEL - Abnormal; Notable for the following:       Result Value   Glucose, Bld 128 (*)    Calcium 8.6 (*)    ALT 11 (*)    All other components within normal limits  URINALYSIS, COMPLETE (UACMP) WITH MICROSCOPIC - Abnormal; Notable for the following:    Color, Urine YELLOW (*)    APPearance HAZY (*)    Protein, ur 30 (*)    Leukocytes, UA TRACE (*)    Squamous Epithelial / LPF 6-30 (*)    All other components within normal limits  URINE CULTURE  LIPASE, BLOOD  CBC  POC URINE PREG, ED  POCT PREGNANCY, URINE   ____________________________________________  EKG   ____________________________________________  RADIOLOGY   ____________________________________________   PROCEDURES  Procedure(s) performed:   Procedures  Critical Care performed:   ____________________________________________   INITIAL IMPRESSION / ASSESSMENT AND PLAN / ED COURSE  Pertinent labs & imaging results that were available during my care of the patient were reviewed by me and considered in my medical decision making (see chart for  details).  ----------------------------------------- 11:56 AM on 06/06/2017 -----------------------------------------  ----------------------------------------- 11:56 AM on 06/06/2017 -----------------------------------------   OBSERVATION CARE: This patient is being placed under observation care for the following reasons: Abdominal pain patients requiring serial exams to determine response to treatment     END OF OBSERVATION STATUS: After an appropriate period of observation, this patient is being discharged due to the following reason(s):  Patient pain-free at this time and tolerating by mouth fluids. Also with reassuring blood work.  ----------------------------------------- 11:55 AM on 06/06/2017 -----------------------------------------  Patient at this time is pain-free and is able tolerate by mouth fluids. Reassuring labs. Likely viral etiology. Advised the patient to return for any worsening or concerning symptoms especially return of abdominal pain or vomiting or nausea. Patient is understanding the plan and willing to comply.      ____________________________________________   FINAL CLINICAL IMPRESSION(S) / ED DIAGNOSES  Abdominal pain with nausea and vomiting.    NEW MEDICATIONS STARTED DURING THIS VISIT:  New Prescriptions   No medications on file  Note:  This document was prepared using Dragon voice recognition software and may include unintentional dictation errors.     Myrna Blazer, MD 06/06/17 [phone]

## 2017-06-08 LAB — URINE CULTURE

## 2017-07-23 ENCOUNTER — Encounter: Payer: Self-pay | Admitting: Emergency Medicine

## 2017-07-23 ENCOUNTER — Emergency Department
Admission: EM | Admit: 2017-07-23 | Discharge: 2017-07-23 | Disposition: A | Discharge status: Home / Self Care | Payer: Self-pay | Attending: Emergency Medicine | Admitting: Emergency Medicine

## 2017-07-23 DIAGNOSIS — Z79899 Other long term (current) drug therapy: Secondary | ICD-10-CM | POA: Insufficient documentation

## 2017-07-23 DIAGNOSIS — F17210 Nicotine dependence, cigarettes, uncomplicated: Secondary | ICD-10-CM | POA: Insufficient documentation

## 2017-07-23 DIAGNOSIS — E282 Polycystic ovarian syndrome: Secondary | ICD-10-CM | POA: Insufficient documentation

## 2017-07-23 DIAGNOSIS — N644 Mastodynia: Secondary | ICD-10-CM | POA: Insufficient documentation

## 2017-07-23 LAB — CBC WITH DIFFERENTIAL/PLATELET
BASOS PCT: 1 %
Basophils Absolute: 0.1 10*3/uL (ref 0–0.1)
EOS ABS: 0.1 10*3/uL (ref 0–0.7)
EOS PCT: 1 %
HCT: 39 % (ref 35.0–47.0)
HEMOGLOBIN: 13.4 g/dL (ref 12.0–16.0)
LYMPHS ABS: 3 10*3/uL (ref 1.0–3.6)
Lymphocytes Relative: 33 %
MCH: 30.1 pg (ref 26.0–34.0)
MCHC: 34.3 g/dL (ref 32.0–36.0)
MCV: 87.7 fL (ref 80.0–100.0)
MONO ABS: 0.7 10*3/uL (ref 0.2–0.9)
MONOS PCT: 8 %
NEUTROS PCT: 59 %
Neutro Abs: 5.5 10*3/uL (ref 1.4–6.5)
PLATELETS: 313 10*3/uL (ref 150–440)
RBC: 4.44 MIL/uL (ref 3.80–5.20)
RDW: 12.7 % (ref 11.5–14.5)
WBC: 9.3 10*3/uL (ref 3.6–11.0)

## 2017-07-23 LAB — COMPREHENSIVE METABOLIC PANEL
ALK PHOS: 51 U/L (ref 38–126)
ALT: 13 U/L — AB (ref 14–54)
AST: 22 U/L (ref 15–41)
Albumin: 4 g/dL (ref 3.5–5.0)
Anion gap: 7 (ref 5–15)
BUN: 12 mg/dL (ref 6–20)
CALCIUM: 9.1 mg/dL (ref 8.9–10.3)
CHLORIDE: 104 mmol/L (ref 101–111)
CO2: 27 mmol/L (ref 22–32)
CREATININE: 0.76 mg/dL (ref 0.44–1.00)
GFR calc non Af Amer: >60 mL/min (ref >60)
Glucose, Bld: 80 mg/dL (ref 65–99)
Potassium: 3.7 mmol/L (ref 3.5–5.1)
SODIUM: 138 mmol/L (ref 135–145)
Total Bilirubin: 0.6 mg/dL (ref 0.3–1.2)
Total Protein: 7.3 g/dL (ref 6.5–8.1)

## 2017-07-23 NOTE — ED Notes (Signed)
Pt with left lateral breast pain that started 3 days ago.

## 2017-07-23 NOTE — ED Triage Notes (Signed)
Pt reports left breast pain and felt a lump three days ago. Denies fever at home. Denies nausea or vomiting. No apparent distress noted in triage. Pt ambulatory to triage.

## 2017-07-23 NOTE — ED Provider Notes (Signed)
[facility] Emergency Department Provider Note  ____________________________________________  Time seen: Approximately 3:24 PM  I have reviewed the triage vital signs and the nursing notes.   HISTORY  Chief Complaint Breast Pain    HPI Kathleen Hardy is a 31 y.o. female who presents to emergency department complaining of left breast pain 3 days. Patient reports that she has pain to the left lateral breast. Patient reports that she has not seen any provider for routine health screening's. Patient reports that she has a strong familial history of rest cancer with her mother being diagnosed around this age. Patient denies any fevers or chills, nausea or vomiting, weight loss, night sweats. Patient reports that she does not perform self breast exam. She denies any drainage or discharge from her nipple. No other complaints at this time. No medications prior to arrival.   Past Medical History:  Diagnosis Date  . PCOS (polycystic ovarian syndrome)     There are no active problems to display for this patient.   No past surgical history on file.  Prior to Admission medications   Medication Sig Start Date End Date Taking? Authorizing Provider  albuterol (PROVENTIL HFA;VENTOLIN HFA) 108 (90 Base) MCG/ACT inhaler Inhale 2 puffs into the lungs every 6 (six) hours as needed for wheezing or shortness of breath. 01/12/17   Enid Derry, PA-C  azithromycin (ZITHROMAX Z-PAK) 250 MG tablet Take 2 tablets (500 mg) on  Day 1,  followed by 1 tablet (250 mg) once daily on Days 2 through 5. 02/12/16   Beers, Charmayne Sheer, PA-C  chlorpheniramine (CHLOR-TRIMETON) 4 MG tablet Take 1 tablet (4 mg total) by mouth 2 (two) times daily as needed for allergies or rhinitis. 02/12/16   Beers, Charmayne Sheer, PA-C  dicyclomine (BENTYL) 20 MG tablet Take 1 tablet (20 mg total) by mouth 3 (three) times daily as needed for spasms. 06/06/17 06/06/18  Myrna Blazer, MD  guaiFENesin-codeine 100-10  MG/5ML syrup Take 10 mLs by mouth every 4 (four) hours as needed for cough. 02/12/16   Beers, Charmayne Sheer, PA-C  ibuprofen (ADVIL,MOTRIN) 800 MG tablet Take 1 tablet (800 mg total) by mouth every 8 (eight) hours as needed. 07/15/16   Emily Filbert, MD  ondansetron (ZOFRAN) 4 MG tablet Take 1 tablet (4 mg total) by mouth daily as needed. 06/06/17   Schaevitz, Myra Rude, MD  predniSONE (DELTASONE) 50 MG tablet Take 1 tablet (50 mg total) by mouth daily with breakfast. 04/07/16   Jene Every, MD  pseudoephedrine (SUDAFED) 60 MG tablet Take 1 tablet (60 mg total) by mouth every 4 (four) hours as needed for congestion. 02/12/16   Beers, Charmayne Sheer, PA-C    Allergies Shellfish allergy  No family history on file.  Social History Social History  Substance Use Topics  . Smoking status: Current Every Day Smoker    Packs/day: 0.50    Years: 10.00    Types: Cigarettes  . Smokeless tobacco: Never Used  . Alcohol use Yes     Comment: occasionally     Review of Systems  Constitutional: No fever/chills Eyes: No visual changes. No discharge ENT: No upper respiratory complaints. Cardiovascular: no chest pain. Respiratory: no cough. No SOB. Gastrointestinal: No abdominal pain.  No nausea, no vomiting.  No diarrhea.  No constipation. Musculoskeletal: Negative for musculoskeletal pain. Skin: Negative for rash, abrasions, lacerations, ecchymosis.Positive for left breast pain. Neurological: Negative for headaches, focal weakness or numbness. 10-point ROS otherwise negative.  ____________________________________________   PHYSICAL EXAM:  VITAL SIGNS: ED Triage Vitals  Enc Vitals Group     BP 07/23/17 1501 108/63     Pulse Rate 07/23/17 1501 79     Resp 07/23/17 1501 18     Temp 07/23/17 1501 99 F (37.2 C)     Temp Source 07/23/17 1501 Oral     SpO2 07/23/17 1501 100 %     Weight 07/23/17 1501 160 lb (72.6 kg)     Height 07/23/17 1501 5\' 7"  (1.702 m)     Head Circumference --       Peak Flow --      Pain Score 07/23/17 1500 7     Pain Loc --      Pain Edu? --      Excl. in GC? --      Constitutional: Alert and oriented. Well appearing and in no acute distress. Eyes: Conjunctivae are normal. PERRL. EOMI. Head: Atraumatic. ENT:      Ears:       Nose: No congestion/rhinnorhea.      Mouth/Throat: Mucous membranes are moist.  Neck: No stridor. Neck is supple. Range of motion Hematological/Lymphatic/Immunilogical: No cervical lymphadenopathy. No palpable left axillary lymphadenopathy. Cardiovascular: Normal rate, regular rhythm. Normal S1 and S2.  Good peripheral circulation. Respiratory: Normal respiratory effort without tachypnea or retractions. Lungs CTAB. Good air entry to the bases with no decreased or absent breath sounds. Gastrointestinal: Bowel sounds 4 quadrants. Soft and nontender to palpation. No guarding or rigidity. No palpable masses. No distention.  Musculoskeletal: Full range of motion to all extremities. No gross deformities appreciated. Neurologic:  Normal speech and language. No gross focal neurologic deficits are appreciated.  Skin:  Skin is warm, dry and intact. No rash noted. Nose gross abnormality to skin of the left breast. No erythema. No edema. No drainage from the nipple. No Peau d orange on the left breast. Patient is tender to palpation from 2:00 to 4:00 in the left lateral breast. Patient does have some palpable cystic like structure to the left breast. This is not underlying the area of tenderness. No palpable firmness, mass, cystic lesions underneath area of tenderness. Psychiatric: Mood and affect are normal. Speech and behavior are normal. Patient exhibits appropriate insight and judgement.  Examination performed with RN in the room. ____________________________________________   LABS (all labs ordered are listed, but only abnormal results are displayed)  Labs Reviewed  COMPREHENSIVE METABOLIC PANEL - Abnormal; Notable for the  following:       Result Value   ALT 13 (*)    All other components within normal limits  CBC WITH DIFFERENTIAL/PLATELET   ____________________________________________  EKG   ____________________________________________  RADIOLOGY   No results found.  ____________________________________________    PROCEDURES  Procedure(s) performed:    Procedures    Medications - No data to display   ____________________________________________   INITIAL IMPRESSION / ASSESSMENT AND PLAN / ED COURSE  Pertinent labs & imaging results that were available during my care of the patient were reviewed by me and considered in my medical decision making (see chart for details).  Review of the [place] CSRS was performed in accordance of the [facility] prior to dispensing any controlled drugs.  Clinical Course as of Jul 23 1657  Thu Jul 23, 2017  1655 Patient's labs returned with reassuring results with no signs of infection, no gross abnormalities in blood counts. Originally, I had ordered a ultrasound for further evaluation. After discussing the case with the radiologist, she advises patient follow-up through  outpatient clinic for further evaluation. Patient did not have a primary care or resources to follow up in a such she is being referred to a managed program for women that will allow the patient to receive further testing as needed without a primary care or financial resources to otherwise follow up. Contact information is given to patient and she verbalizes she will follow-up. At this time, exam is reassuring, labs are reassuring, patient is stable for discharge.  [JC]    Clinical Course User Index [JC] Cuthriell, Delorise RoyalsJonathan D, PA-C    Patient's diagnosis is consistent with Left-sided breast pain. See above note for clinical course.. Patient may take Tylenol and Motrin as needed at home. She will follow-up with with managed program as described above for follow-up imaging. Patient is given ED  precautions to return to the ED for any worsening or new symptoms.     ____________________________________________  FINAL CLINICAL IMPRESSION(S) / ED DIAGNOSES  Final diagnoses:  Breast pain, left      NEW MEDICATIONS STARTED DURING THIS VISIT:  New Prescriptions   No medications on file        This chart was dictated using voice recognition software/Dragon. Despite best efforts to proofread, errors can occur which can change the meaning. Any change was purely unintentional.    Racheal PatchesCuthriell, Jonathan D, PA-C 07/23/17 1658    Rockne MenghiniNorman, Anne-Caroline, MD 07/23/17 [phone]

## 2017-08-10 ENCOUNTER — Ambulatory Visit: Payer: Self-pay | Attending: Oncology

## 2017-08-10 ENCOUNTER — Encounter (INDEPENDENT_AMBULATORY_CARE_PROVIDER_SITE_OTHER): Payer: Self-pay

## 2017-08-10 ENCOUNTER — Encounter: Payer: Self-pay | Admitting: *Deleted

## 2017-08-10 VITALS — BP 138/81 | HR 73 | Temp 98.7°F | Ht 69.0 in | Wt 174.0 lb

## 2017-08-10 DIAGNOSIS — R079 Chest pain, unspecified: Secondary | ICD-10-CM

## 2017-08-10 NOTE — Progress Notes (Signed)
Subjective:     Patient ID: Kathleen Hardy, female   DOB: [birth year], 31 y.o.   MRN: [id number]  HPI   Review of Systems     Objective:   Physical Exam  Pulmonary/Chest: Right breast exhibits no inverted nipple, no mass, no nipple discharge, no skin change and no tenderness. Left breast exhibits tenderness. Left breast exhibits no inverted nipple, no mass, no nipple discharge and no skin change. Breasts are asymmetrical.    Large pendulous breasts; Left breast larger than right. Patient reports a 14 lb weight gain.       Assessment:     31 year old patient presents for [facility] clinic visit. Instructed patient on breast self-exam using teach back method.  Patient screened, and meets [facility] eligibility.  Patient does not have insurance, Medicare or Medicaid.  Handout given on Affordable Care Act.  Instructed patient on breast self-exam using teach back method.  Patient reports left breast pain x 1 month radiating to back.  Rates 7 on 0-10 scale.  Pain has been constant past week.  Taking Ibuprofen with some relief.  No palpable mass or lump. Currently on menstrual cycle. Reports pain not related to cycle.    Plan:     Joellyn Quails scheduled appointment for patient with Dr. Lemar Livings  for consult and possible ultrasound.

## 2017-08-20 ENCOUNTER — Ambulatory Visit (INDEPENDENT_AMBULATORY_CARE_PROVIDER_SITE_OTHER): Payer: PRIVATE HEALTH INSURANCE | Admitting: General Surgery

## 2017-08-20 ENCOUNTER — Inpatient Hospital Stay: Payer: Self-pay

## 2017-08-20 ENCOUNTER — Encounter: Payer: Self-pay | Admitting: General Surgery

## 2017-08-20 VITALS — BP 112/64 | HR 86 | Resp 14 | Ht 67.0 in | Wt 176.0 lb

## 2017-08-20 DIAGNOSIS — N644 Mastodynia: Secondary | ICD-10-CM

## 2017-08-20 DIAGNOSIS — N6320 Unspecified lump in the left breast, unspecified quadrant: Secondary | ICD-10-CM

## 2017-08-20 NOTE — Progress Notes (Signed)
Patient ID: Kathleen Hardy, female   DOB: 01/15/1986, 31 y.o.   MRN: [id number]  Chief Complaint  Patient presents with  . Breast Problem    HPI Anagabriela Belvin is a 31 y.o. female. Here today for evaluation of left breat pain referred by Coralee Rud RN with [facility]. She has felt a left breast mass "grape" size for about a month. She states the breast swelling occurred overnight and has went down. She does admit to some tenderness. She admits that she has started drinking [employer] again about 2 months ago, and it was in few weeks that she had this sudden onset of left breast pain and swelling. No previous similar episodes. No history of trauma.. Denies any redness, injury or trauma. LMP 08-06-17. She works at [employer] in [place].  HPI  Past Medical History:  Diagnosis Date  . PCOS (polycystic ovarian syndrome)     Past Surgical History:  Procedure Laterality Date  . WISDOM TOOTH EXTRACTION      Family History  Problem Relation Age of Onset  . Breast cancer Maternal Grandmother 67       great grandmother  . Colon cancer Neg Hx     Social History Social History  Substance Use Topics  . Smoking status: Current Every Day Smoker    Packs/day: 0.50    Years: 10.00    Types: Cigarettes  . Smokeless tobacco: Never Used  . Alcohol use Yes     Comment: occasionally    Allergies  Allergen Reactions  . Shellfish Allergy Itching    Throat itching tongue swelling     Current Outpatient Prescriptions  Medication Sig Dispense Refill  . albuterol (PROVENTIL HFA;VENTOLIN HFA) 108 (90 Base) MCG/ACT inhaler Inhale 2 puffs into the lungs every 6 (six) hours as needed for wheezing or shortness of breath. 1 Inhaler 0  . ibuprofen (ADVIL,MOTRIN) 800 MG tablet Take 1 tablet (800 mg total) by mouth every 8 (eight) hours as needed. 30 tablet 0   No current facility-administered medications for this visit.     Review of Systems Review of Systems  Constitutional: Negative.    Respiratory: Negative.   Cardiovascular: Negative.     Blood pressure 112/64, pulse 86, resp. rate 14, height  (1.702 m), weight 176 lb (79.8 kg), last menstrual period 08/06/2017.  Physical Exam Physical Exam  Constitutional: She is oriented to person, place, and time. She appears well-developed and well-nourished.  HENT:  Mouth/Throat: Oropharynx is clear and moist.  Eyes: Conjunctivae are normal. No scleral icterus.  Neck: Neck supple.  Cardiovascular: Normal rate, regular rhythm and normal heart sounds.   Pulmonary/Chest: Effort normal and breath sounds normal. Right breast exhibits no inverted nipple, no mass, no nipple discharge, no skin change and no tenderness. Left breast exhibits no inverted nipple, no mass, no nipple discharge, no skin change and no tenderness.    Lymphadenopathy:    She has no cervical adenopathy.    She has no axillary adenopathy.       Left: No supraclavicular adenopathy present.  Neurological: She is alert and oriented to person, place, and time.  Skin: Skin is warm and dry.  Psychiatric: Her behavior is normal.    Data Reviewed Emergency department evaluation of 07/23/2017 reviewed. CBC and comprehensive metabolic panel obtained at that emergency department visit were unremarkable.  Assessment    Left breast mastalgia, resolved. No clinical abnormality on today's exam.    Plan    Patient's symptoms may been triggered  by an abrupt change in her dietary caffeine intake. Observation is warranted at this time.    Follow up as needed. The patient is aware to call back for any questions or new concerns.   HPI, Physical Exam, Assessment and Plan have been scribed under the direction and in the presence of Earline Mayotte, MD. Dorathy Daft, RN  I have completed the exam and reviewed the above documentation for accuracy and completeness.  I agree with the above.  Museum/gallery conservator has been used and any errors in dictation or  transcription are unintentional.  Donnalee Curry, M.D., F.A.C.S.  Earline Mayotte 08/20/2017, 8:29 PM

## 2017-08-20 NOTE — Patient Instructions (Signed)
The patient is aware to call back for any questions or new concerns.  

## 2017-08-21 NOTE — Progress Notes (Signed)
Per Dr. Lemar Livings, no suspicious findings on exam.  Follow-up as needed.  Copy to [facility].

## 2018-02-09 ENCOUNTER — Emergency Department
Admission: EM | Admit: 2018-02-09 | Discharge: 2018-02-09 | Disposition: A | Discharge status: Home / Self Care | Payer: Self-pay | Attending: Emergency Medicine | Admitting: Emergency Medicine

## 2018-02-09 ENCOUNTER — Encounter: Payer: Self-pay | Admitting: Emergency Medicine

## 2018-02-09 ENCOUNTER — Other Ambulatory Visit: Payer: Self-pay

## 2018-02-09 DIAGNOSIS — R112 Nausea with vomiting, unspecified: Secondary | ICD-10-CM | POA: Insufficient documentation

## 2018-02-09 DIAGNOSIS — F17210 Nicotine dependence, cigarettes, uncomplicated: Secondary | ICD-10-CM | POA: Insufficient documentation

## 2018-02-09 DIAGNOSIS — B349 Viral infection, unspecified: Secondary | ICD-10-CM

## 2018-02-09 DIAGNOSIS — R070 Pain in throat: Secondary | ICD-10-CM | POA: Insufficient documentation

## 2018-02-09 DIAGNOSIS — F1210 Cannabis abuse, uncomplicated: Secondary | ICD-10-CM | POA: Insufficient documentation

## 2018-02-09 LAB — URINALYSIS, COMPLETE (UACMP) WITH MICROSCOPIC
Glucose, UA: NEGATIVE mg/dL
Hgb urine dipstick: NEGATIVE
Ketones, ur: NEGATIVE mg/dL
Leukocytes, UA: NEGATIVE
Nitrite: NEGATIVE
Protein, ur: NEGATIVE mg/dL
Specific Gravity, Urine: 1.025 (ref 1.005–1.030)
pH: 7 (ref 5.0–8.0)

## 2018-02-09 LAB — POCT PREGNANCY, URINE: Preg Test, Ur: NEGATIVE

## 2018-02-09 LAB — GROUP A STREP BY PCR: Group A Strep by PCR: NOT DETECTED

## 2018-02-09 MED ORDER — ONDANSETRON 4 MG PO TBDP: 4.0000 mg | ORAL_TABLET | Freq: Three times a day (TID) | ORAL | 0 refills | Status: DC | PRN

## 2018-02-09 MED ORDER — ONDANSETRON HCL 4 MG/2ML IJ SOLN
4.0000 mg | Freq: Once | INTRAMUSCULAR | Status: AC
  Administered 2018-02-09: 4 mg via INTRAVENOUS
  Filled 2018-02-09: qty 2

## 2018-02-09 NOTE — ED Notes (Signed)
See triage note  States she developed cough and sore throat yesterday  Unsure of fever but has had hot and cold flashes  States also has had some vomiting last time vomited was about 2-3 hrs PTA  Afebrile on arival

## 2018-02-09 NOTE — ED Notes (Signed)
Pt ambulatory upon discharge; refused wheel chair. Verbalized understanding of discharge instructions, follow-up care and prescription. VSS. Skin warm and dry. A&O x4. Denies any pain and states "I feel a lot better." This RN walked pt out of flex care area.

## 2018-02-09 NOTE — ED Triage Notes (Signed)
Pt reports that she has had a cough, sore throat and has vomited since yesterday afternoon. She states that she does go from hot to cold.

## 2018-02-09 NOTE — ED Provider Notes (Signed)
[facility] Emergency Department Provider Note   ____________________________________________   First MD Initiated Contact with Patient 02/09/18 1212     (approximate)  I have reviewed the triage vital signs and the nursing notes.   HISTORY  Chief Complaint Cough; Sore Throat; and Emesis  HPI Kathleen Hardy is a 32 y.o. female is here with complaint of sore throat and nausea that started last evening.  Patient states that she also has been vomiting with the last time being at approximately 11 AM today.  Patient has felt feverish but has not actually taken her temperature.  She is unaware of any exposure to strep throat.  She denies any abdominal pain or diarrhea.  She also states that her last period was less than her normal flow.  She rates her pain as a 4 out of 10.  Past Medical History:  Diagnosis Date  . PCOS (polycystic ovarian syndrome)     Patient Active Problem List   Diagnosis Date Noted  . Breast pain, left 08/20/2017    Past Surgical History:  Procedure Laterality Date  . WISDOM TOOTH EXTRACTION      Prior to Admission medications   Medication Sig Start Date End Date Taking? Authorizing Provider  albuterol (PROVENTIL HFA;VENTOLIN HFA) 108 (90 Base) MCG/ACT inhaler Inhale 2 puffs into the lungs every 6 (six) hours as needed for wheezing or shortness of breath. 01/12/17   Enid DerryWagner, Ashley, PA-C  ondansetron (ZOFRAN ODT) 4 MG disintegrating tablet Take 1 tablet (4 mg total) by mouth every 8 (eight) hours as needed for nausea or vomiting. 02/09/18   Tommi RumpsSummers, Rhonda L, PA-C    Allergies Shellfish allergy  Family History  Problem Relation Age of Onset  . Breast cancer Maternal Grandmother [id number]       great grandmother  . Colon cancer Neg Hx     Social History Social History   Tobacco Use  . Smoking status: Current Every Day Smoker    Packs/day: 0.50    Years: 10.00    Pack years: 5.00    Types: Cigarettes  . Smokeless tobacco: Never  Used  Substance Use Topics  . Alcohol use: Yes    Comment: occasionally  . Drug use: Yes    Types: Marijuana    Review of Systems Constitutional: No fever/chills Eyes: No visual changes. ENT: Positive for sore throat.  Negative for ear pain. Cardiovascular: Denies chest pain. Respiratory: Denies shortness of breath.  Positive for cough. Gastrointestinal: No abdominal pain.  Positive nausea, positive vomiting.  No diarrhea.  Genitourinary: Negative for dysuria.  Menses lighter than usual. Musculoskeletal: Negative for back pain. Skin: Negative for rash. Neurological: Negative for headaches, focal weakness or numbness. ____________________________________________   PHYSICAL EXAM:  VITAL SIGNS: ED Triage Vitals  Enc Vitals Group     BP 02/09/18 1201 128/82     Pulse Rate 02/09/18 1201 86     Resp 02/09/18 1201 20     Temp 02/09/18 1201 98.7 F (37.1 C)     Temp Source 02/09/18 1201 Oral     SpO2 02/09/18 1201 96 %     Weight 02/09/18 1202 164 lb (74.4 kg)     Height 02/09/18 1202 5\' 7"  (1.702 m)     Head Circumference --      Peak Flow --      Pain Score 02/09/18 1202 4     Pain Loc --      Pain Edu? --      Excl.  in GC? --    Constitutional: Alert and oriented. Well appearing and in no acute distress. Eyes: Conjunctivae are normal.  Head: Atraumatic. Nose: No congestion/rhinnorhea. Mouth/Throat: Mucous membranes are moist.  Oropharynx non-erythematous.  No exudate and uvula is midline. Neck: No stridor.   Hematological/Lymphatic/Immunilogical: No cervical lymphadenopathy. Cardiovascular: Normal rate, regular rhythm. Grossly normal heart sounds.  Good peripheral circulation. Respiratory: Normal respiratory effort.  No retractions. Lungs CTAB. Gastrointestinal: Soft and nontender. No distention. Musculoskeletal: Moves upper and lower extremities without any difficulty and normal gait was noted. Neurologic:  Normal speech and language. No gross focal neurologic  deficits are appreciated.  Skin:  Skin is warm, dry and intact. No rash noted. Psychiatric: Mood and affect are normal. Speech and behavior are normal.  ____________________________________________   LABS (all labs ordered are listed, but only abnormal results are displayed)  Labs Reviewed  URINALYSIS, COMPLETE (UACMP) WITH MICROSCOPIC - Abnormal; Notable for the following components:      Result Value   Color, Urine YELLOW (*)    APPearance HAZY (*)    Bilirubin Urine SMALL (*)    Bacteria, UA RARE (*)    Squamous Epithelial / LPF 6-30 (*)    All other components within normal limits  GROUP A STREP BY PCR  POC URINE PREG, ED  POCT PREGNANCY, URINE     PROCEDURES  Procedure(s) performed: None  Procedures  Critical Care performed: No  ____________________________________________   INITIAL IMPRESSION / ASSESSMENT AND PLAN / ED COURSE Patient presents to the emergency department with complaint of sore throat and vomiting.  Patient was afebrile on arrival to the ED.  She was given water while waiting for test results and began vomiting.  She was given Zofran with relief of her symptoms.  Strep test and urinalysis were negative and patient was reassured.  Patient was discharged with a prescription for Zofran.  She is to remain on clear liquids for the next 10-12 hours.  She is to follow-up with [facility] acute care if any continued problems.  ____________________________________________   FINAL CLINICAL IMPRESSION(S) / ED DIAGNOSES  Final diagnoses:  Viral illness     ED Discharge Orders        Ordered    ondansetron (ZOFRAN ODT) 4 MG disintegrating tablet  Every 8 hours PRN     02/09/18 1536       Note:  This document was prepared using Dragon voice recognition software and may include unintentional dictation errors.    Tommi Rumps, PA-C 02/09/18 1540    Jeanmarie Plant, MD 02/09/18 1544

## 2018-02-09 NOTE — Discharge Instructions (Signed)
Follow-up with [facility] acute care if any continued problems.  Use Zofran every 8 hours as needed for nausea or vomiting.  Clear liquids for the next 10 hours.  And if no continued vomiting may begin with crackers, rice, toast, applesauce and ice chips.  Take Tylenol if needed for throat pain.  Return to the emergency department if any severe worsening of your symptoms.

## 2019-04-19 ENCOUNTER — Encounter: Payer: Self-pay | Admitting: Emergency Medicine

## 2019-04-19 ENCOUNTER — Other Ambulatory Visit: Payer: Self-pay

## 2019-04-19 ENCOUNTER — Emergency Department
Admission: EM | Admit: 2019-04-19 | Discharge: 2019-04-19 | Disposition: A | Discharge status: Home / Self Care | Payer: Self-pay | Attending: Emergency Medicine | Admitting: Emergency Medicine

## 2019-04-19 DIAGNOSIS — F17210 Nicotine dependence, cigarettes, uncomplicated: Secondary | ICD-10-CM | POA: Insufficient documentation

## 2019-04-19 DIAGNOSIS — K047 Periapical abscess without sinus: Secondary | ICD-10-CM | POA: Insufficient documentation

## 2019-04-19 MED ORDER — PENICILLIN V POTASSIUM 250 MG PO TABS
500.0000 mg | ORAL_TABLET | Freq: Once | ORAL | Status: AC
Start: 2019-04-19 — End: 2019-04-19
  Administered 2019-04-19: 500 mg via ORAL
  Filled 2019-04-19: qty 2

## 2019-04-19 MED ORDER — PENICILLIN V POTASSIUM 500 MG PO TABS: 500.0000 mg | ORAL_TABLET | Freq: Four times a day (QID) | ORAL | 0 refills | Status: DC

## 2019-04-19 MED ORDER — OXYCODONE-ACETAMINOPHEN 5-325 MG PO TABS: 1.0000 | ORAL_TABLET | ORAL | 0 refills | Status: AC | PRN

## 2019-04-19 MED ORDER — OXYCODONE-ACETAMINOPHEN 5-325 MG PO TABS
1.0000 | ORAL_TABLET | Freq: Once | ORAL | Status: AC
  Administered 2019-04-19: 1 via ORAL
  Filled 2019-04-19: qty 1

## 2019-04-19 NOTE — Discharge Instructions (Addendum)
Use ibuprofen for the pain.  You can take up to 4 the over-the-counter pills 3 times a day with food for 3 or 4 days.  If j that is not enough, use the Percocet 1 pill 4 times a day for the pain.  Be careful the Percocet can make you woozy and constipated.  Do not drive on it.  You can use the Percocet and Motrin together.  Do not take any Tylenol with it because there is already some Tylenol in the Percocet and you could overdose on the Tylenol.  Take the penicillin 1 pill 4 times a day or 2 pills twice a day to help with the infection.  Call the dentist in the morning let them know what is going on they should be out of see you in a few days and get working on the tooth.  Return for increasing swelling pain or fever.  Also return if you feel sicker.

## 2019-04-19 NOTE — ED Provider Notes (Signed)
[facility] Emergency Department Provider Note   ____________________________________________   First MD Initiated Contact with Patient 04/19/19 [phone]     (approximate)  I have reviewed the triage vital signs and the nursing notes.   HISTORY  Chief Complaint Oral Swelling    HPI Kathleen Hardy is a 33 y.o. female patient reports about 1 week of swelling the left side of the jaw with some pain.  Percussion of the T shows that she has tenderness on percussion of 1 of the anterior molars on that side.  She is otherwise doing well.  Pain is moderately severe deep and achy.  Made worse with chewing.        Past Medical History:  Diagnosis Date  . PCOS (polycystic ovarian syndrome)     Patient Active Problem List   Diagnosis Date Noted  . Breast pain, left 08/20/2017    Past Surgical History:  Procedure Laterality Date  . WISDOM TOOTH EXTRACTION      Prior to Admission medications   Medication Sig Start Date End Date Taking? Authorizing Provider  albuterol (PROVENTIL HFA;VENTOLIN HFA) 108 (90 Base) MCG/ACT inhaler Inhale 2 puffs into the lungs every 6 (six) hours as needed for wheezing or shortness of breath. 01/12/17   Enid DerryWagner, Ashley, PA-C  ondansetron (ZOFRAN ODT) 4 MG disintegrating tablet Take 1 tablet (4 mg total) by mouth every 8 (eight) hours as needed for nausea or vomiting. 02/09/18   Tommi RumpsSummers, Rhonda L, PA-C  oxyCODONE-acetaminophen (PERCOCET) 5-325 MG tablet Take 1 tablet by mouth every 4 (four) hours as needed for severe pain. 04/19/19 04/18/20  Arnaldo NatalMalinda, Paul F, MD  penicillin v potassium (VEETID) 500 MG tablet Take 1 tablet (500 mg total) by mouth 4 (four) times daily. 04/19/19   Arnaldo NatalMalinda, Paul F, MD    Allergies Shellfish allergy  Family History  Problem Relation Age of Onset  . Breast cancer Maternal Grandmother [id number]       great grandmother  . Colon cancer Neg Hx     Social History Social History   Tobacco Use  . Smoking status:  Current Every Day Smoker    Packs/day: 0.50    Years: 10.00    Pack years: 5.00    Types: Cigarettes  . Smokeless tobacco: Never Used  Substance Use Topics  . Alcohol use: Yes    Comment: occasionally  . Drug use: Yes    Types: Marijuana    Review of Systems  Constitutional: No fever/chills Eyes: No visual changes. ENT: No sore throat. Cardiovascular: Denies chest pain. Respiratory: Denies shortness of breath. Gastrointestinal: No abdominal pain.  No nausea, no vomiting.  No diarrhea.  No constipation. Genitourinary: Negative for dysuria. Musculoskeletal: Negative for back pain. Skin: Negative for rash. Neurological: Negative for headaches, focal weakness   ____________________________________________   PHYSICAL EXAM:  VITAL SIGNS: ED Triage Vitals [04/19/19 0529]  Enc Vitals Group     BP (!) 132/92     Pulse Rate (!) 54     Resp 17     Temp 98.3 F (36.8 C)     Temp Source Oral     SpO2 99 %     Weight      Height      Head Circumference      Peak Flow      Pain Score      Pain Loc      Pain Edu?      Excl. in GC?     Constitutional: Alert  and oriented. Well appearing and in no acute distress. Eyes: Conjunctivae are normal. Head: Atraumatic. Nose: No congestion/rhinnorhea. Mouth/Throat: Mucous membranes are moist.  Oropharynx non-erythematous.  Tender tooth percussion of the left lower jaw is noted.  There is some swelling along the left lower jaw as well but is not severe. Neck: No stridor.  Cardiovascular: Normal rate, regular rhythm. Grossly normal heart sounds.  Good peripheral circulation. Respiratory: Normal respiratory effort.  No retractions. Lungs CTAB. Gastrointestinal: Soft and nontender. No distention. No abdominal bruits.  Musculoskeletal: No lower extremity tenderness nor edema.   Neurologic:  Normal speech and language. No gross focal neurologic deficits are appreciated. Skin:  Skin is warm, dry and intact. No rash noted.    ____________________________________________   LABS (all labs ordered are listed, but only abnormal results are displayed)  Labs Reviewed - No data to display ____________________________________________  EKG   ____________________________________________  RADIOLOGY  ED MD interpretation:    Official radiology report(s): No results found.  ____________________________________________   PROCEDURES  Procedure(s) performed (including Critical Care):  Procedures   ____________________________________________   INITIAL IMPRESSION / ASSESSMENT AND PLAN / ED COURSE We will treat the patient with penicillin and Percocet.  Suggest Motrin first.  We will give her a list of dentist.  She will return if she has any further problems or any worsening.              ____________________________________________   FINAL CLINICAL IMPRESSION(S) / ED DIAGNOSES  Final diagnoses:  Tooth infection     ED Discharge Orders         Ordered    penicillin v potassium (VEETID) 500 MG tablet  4 times daily     04/19/19 0537    oxyCODONE-acetaminophen (PERCOCET) 5-325 MG tablet  Every 4 hours PRN     04/19/19 0537           Note:  This document was prepared using Dragon voice recognition software and may include unintentional dictation errors.    Arnaldo Natal, MD 04/19/19 [phone]

## 2019-04-19 NOTE — ED Triage Notes (Signed)
Pt c/o left sided jaw pain and swelling x1 week. Poor dental hygiene present.

## 2019-05-23 ENCOUNTER — Emergency Department
Admission: EM | Admit: 2019-05-23 | Discharge: 2019-05-23 | Disposition: A | Discharge status: Home / Self Care | Payer: Self-pay | Attending: Emergency Medicine | Admitting: Emergency Medicine

## 2019-05-23 ENCOUNTER — Other Ambulatory Visit: Payer: Self-pay

## 2019-05-23 DIAGNOSIS — F17210 Nicotine dependence, cigarettes, uncomplicated: Secondary | ICD-10-CM | POA: Insufficient documentation

## 2019-05-23 DIAGNOSIS — J029 Acute pharyngitis, unspecified: Secondary | ICD-10-CM | POA: Insufficient documentation

## 2019-05-23 DIAGNOSIS — R509 Fever, unspecified: Secondary | ICD-10-CM | POA: Insufficient documentation

## 2019-05-23 DIAGNOSIS — J069 Acute upper respiratory infection, unspecified: Secondary | ICD-10-CM | POA: Insufficient documentation

## 2019-05-23 LAB — GROUP A STREP BY PCR: Group A Strep by PCR: NOT DETECTED

## 2019-05-23 MED ORDER — LIDOCAINE VISCOUS HCL 2 % MT SOLN
5.0000 mL | Freq: Once | OROMUCOSAL | Status: AC
  Administered 2019-05-23: 5 mL via OROMUCOSAL
  Filled 2019-05-23: qty 15

## 2019-05-23 MED ORDER — DIPHENHYDRAMINE HCL 12.5 MG/5ML PO ELIX
12.5000 mg | ORAL_SOLUTION | Freq: Once | ORAL | Status: AC
  Administered 2019-05-23: 12.5 mg via ORAL
  Filled 2019-05-23: qty 5

## 2019-05-23 MED ORDER — PSEUDOEPH-BROMPHEN-DM 30-2-10 MG/5ML PO SYRP: 5.0000 mL | ORAL_SOLUTION | Freq: Four times a day (QID) | ORAL | 0 refills | Status: DC | PRN

## 2019-05-23 MED ORDER — ACETAMINOPHEN 325 MG PO TABS
650.0000 mg | ORAL_TABLET | Freq: Once | ORAL | Status: AC
  Administered 2019-05-23: 650 mg via ORAL
  Filled 2019-05-23: qty 2

## 2019-05-23 MED ORDER — LIDOCAINE VISCOUS HCL 2 % MT SOLN: 5.0000 mL | Freq: Four times a day (QID) | OROMUCOSAL | 0 refills | Status: DC | PRN

## 2019-05-23 NOTE — ED Provider Notes (Signed)
[facility] Emergency Department Provider Note   ____________________________________________   First MD Initiated Contact with Patient 05/23/19 2236     (approximate)  I have reviewed the triage vital signs and the nursing notes.   HISTORY  Chief Complaint Cough, Fever, and Sore Throat    HPI Kathleen Hardy is a 33 y.o. female patient presents with sore throat for 3 days.  Patient also states cough and fever.  Patient said cough is productive.  Patient states she cannot tolerate foods and fluids.  Patient denies nausea, vomiting, diarrhea.  Patient denies recent travel, positive, or suspected contact with  Covid-19  individuals.  Patient rates the pain as a 2/10.  Patient described the pain as "sore".  No palliative measure for complaint.         Past Medical History:  Diagnosis Date  . PCOS (polycystic ovarian syndrome)     Patient Active Problem List   Diagnosis Date Noted  . Breast pain, left 08/20/2017    Past Surgical History:  Procedure Laterality Date  . WISDOM TOOTH EXTRACTION      Prior to Admission medications   Medication Sig Start Date End Date Taking? Authorizing Provider  albuterol (PROVENTIL HFA;VENTOLIN HFA) 108 (90 Base) MCG/ACT inhaler Inhale 2 puffs into the lungs every 6 (six) hours as needed for wheezing or shortness of breath. 01/12/17   Enid DerryWagner, Ashley, PA-C  brompheniramine-pseudoephedrine-DM 30-2-10 MG/5ML syrup Take 5 mLs by mouth 4 (four) times daily as needed. Mix with 5 mL of viscous lidocaine for swish and swallow. 05/23/19   Joni ReiningSmith,  K, PA-C  lidocaine (XYLOCAINE) 2 % solution Use as directed 5 mLs in the mouth or throat every 6 (six) hours as needed for mouth pain. Mix with 5 mL of Bromfed-DM for swish and swallow. 05/23/19   Joni ReiningSmith,  K, PA-C  ondansetron (ZOFRAN ODT) 4 MG disintegrating tablet Take 1 tablet (4 mg total) by mouth every 8 (eight) hours as needed for nausea or vomiting. 02/09/18   Tommi RumpsSummers,  Rhonda L, PA-C  oxyCODONE-acetaminophen (PERCOCET) 5-325 MG tablet Take 1 tablet by mouth every 4 (four) hours as needed for severe pain. 04/19/19 04/18/20  Arnaldo NatalMalinda, Paul F, MD  penicillin v potassium (VEETID) 500 MG tablet Take 1 tablet (500 mg total) by mouth 4 (four) times daily. 04/19/19   Arnaldo NatalMalinda, Paul F, MD    Allergies Shellfish allergy  Family History  Problem Relation Age of Onset  . Breast cancer Maternal Grandmother [id number]       great grandmother  . Colon cancer Neg Hx     Social History Social History   Tobacco Use  . Smoking status: Current Every Day Smoker    Packs/day: 0.50    Years: 10.00    Pack years: 5.00    Types: Cigarettes  . Smokeless tobacco: Never Used  Substance Use Topics  . Alcohol use: Yes    Comment: occasionally  . Drug use: Yes    Types: Marijuana    Review of Systems  Constitutional: No fever/chills Eyes: No visual changes. ENT: No sore throat. Cardiovascular: Denies chest pain. Respiratory: Denies shortness of breath. Gastrointestinal: No abdominal pain.  No nausea, no vomiting.  No diarrhea.  No constipation. Genitourinary: Negative for dysuria.  PCOS. Musculoskeletal: Negative for back pain. Skin: Negative for rash. Neurological: Negative for headaches, focal weakness or numbness. Allergic/Immunilogical: Shellfish. ____________________________________________   PHYSICAL EXAM:  VITAL SIGNS: ED Triage Vitals  Enc Vitals Group     BP 05/23/19 2206  115/65     Pulse Rate 05/23/19 2206 86     Resp 05/23/19 2206 18     Temp 05/23/19 2206 99.8 F (37.7 C)     Temp Source 05/23/19 2206 Oral     SpO2 05/23/19 2206 98 %     Weight 05/23/19 2204 180 lb (81.6 kg)     Height 05/23/19 2204 5\' 7"  (1.702 m)     Head Circumference --      Peak Flow --      Pain Score 05/23/19 2204 2     Pain Loc --      Pain Edu? --      Excl. in [place]? --    Constitutional: Alert and oriented. Well appearing and in no acute distress. Nose: Edematous nasal  turbinates clear rhinorrhea.. Mouth/Throat: Mucous membranes are moist.  Oropharynx erythematous.  Postnasal drainage. Neck: No stridor.   Hematological/Lymphatic/Immunilogical: No cervical lymphadenopathy. Cardiovascular: Normal rate, regular rhythm. Grossly normal heart sounds.  Good peripheral circulation. Respiratory: Normal respiratory effort.  No retractions. Lungs CTAB. Skin:  Skin is warm, dry and intact. No rash noted. Psychiatric: Mood and affect are normal. Speech and behavior are normal.  ____________________________________________   LABS (all labs ordered are listed, but only abnormal results are displayed)  Labs Reviewed  GROUP A STREP BY PCR   ____________________________________________  EKG   ____________________________________________  RADIOLOGY  ED MD interpretation:    Official radiology report(s): No results found.  ____________________________________________   PROCEDURES  Procedure(s) performed (including Critical Care):  Procedures   ____________________________________________   INITIAL IMPRESSION / ASSESSMENT AND PLAN / ED COURSE  As part of my medical decision making, I reviewed the following data within the [place] was evaluated in Emergency Department on 05/23/2019 for the symptoms described in the history of present illness. She was evaluated in the context of the global COVID-19 pandemic, which necessitated consideration that the patient might be at risk for infection with the SARS-CoV-2 virus that causes COVID-19. Institutional protocols and algorithms that pertain to the evaluation of patients at risk for COVID-19 are in a state of rapid change based on information released by regulatory bodies including the CDC and federal and state organizations. These policies and algorithms were followed during the patient's care in the ED.    Patient presents with sore throat productive cough and  fever for 3 days.  Discussed negative rapid strep test with patient.  Patient given discharge care instruction work note.  Patient advised take medication as directed and [facility]   ____________________________________________   FINAL CLINICAL IMPRESSION(S) / ED DIAGNOSES  Final diagnoses:  Sore throat  Viral URI with cough     ED Discharge Orders         Ordered    brompheniramine-pseudoephedrine-DM 30-2-10 MG/5ML syrup  4 times daily PRN     05/23/19 2334    lidocaine (XYLOCAINE) 2 % solution  Every 6 hours PRN     05/23/19 2334           Note:  This document was prepared using Dragon voice recognition software and may include unintentional dictation errors.    Sable Feil, PA-C 05/23/19 1093    Arta Silence, MD 05/25/19 1535

## 2019-05-23 NOTE — ED Triage Notes (Signed)
Pt arrives to ED via POV from home with c/o cough/ fever, and sore throat x3 days. Pt reports productive cough with clear sputum, fever at home of 103, and sore throat with no difficulty swallowing or managing secretions. Pt is A&O, in NAD; RR even, regular, and unlabored.

## 2019-08-27 ENCOUNTER — Other Ambulatory Visit: Payer: Self-pay

## 2019-08-27 ENCOUNTER — Encounter: Payer: Self-pay | Admitting: Emergency Medicine

## 2019-08-27 ENCOUNTER — Emergency Department
Admission: EM | Admit: 2019-08-27 | Discharge: 2019-08-27 | Disposition: A | Discharge status: Home / Self Care | Payer: Self-pay | Attending: Emergency Medicine | Admitting: Emergency Medicine

## 2019-08-27 DIAGNOSIS — R112 Nausea with vomiting, unspecified: Secondary | ICD-10-CM | POA: Insufficient documentation

## 2019-08-27 DIAGNOSIS — T679XXA Effect of heat and light, unspecified, initial encounter: Secondary | ICD-10-CM | POA: Insufficient documentation

## 2019-08-27 DIAGNOSIS — K047 Periapical abscess without sinus: Secondary | ICD-10-CM | POA: Insufficient documentation

## 2019-08-27 DIAGNOSIS — F17210 Nicotine dependence, cigarettes, uncomplicated: Secondary | ICD-10-CM | POA: Insufficient documentation

## 2019-08-27 DIAGNOSIS — F1210 Cannabis abuse, uncomplicated: Secondary | ICD-10-CM | POA: Insufficient documentation

## 2019-08-27 LAB — URINALYSIS, COMPLETE (UACMP) WITH MICROSCOPIC
Bacteria, UA: NONE SEEN
Bilirubin Urine: NEGATIVE
Glucose, UA: NEGATIVE mg/dL
Hgb urine dipstick: NEGATIVE
Ketones, ur: NEGATIVE mg/dL
Leukocytes,Ua: NEGATIVE
Nitrite: NEGATIVE
Protein, ur: NEGATIVE mg/dL
Specific Gravity, Urine: 1.019 (ref 1.005–1.030)
pH: 6 (ref 5.0–8.0)

## 2019-08-27 LAB — COMPREHENSIVE METABOLIC PANEL
ALT: 24 U/L (ref 0–44)
AST: 22 U/L (ref 15–41)
Albumin: 4.4 g/dL (ref 3.5–5.0)
Alkaline Phosphatase: 57 U/L (ref 38–126)
Anion gap: 10 (ref 5–15)
BUN: 12 mg/dL (ref 6–20)
CO2: 23 mmol/L (ref 22–32)
Calcium: 9.2 mg/dL (ref 8.9–10.3)
Chloride: 100 mmol/L (ref 98–111)
Creatinine, Ser: 0.94 mg/dL (ref 0.44–1.00)
GFR calc Af Amer: >60 mL/min (ref >60)
GFR calc non Af Amer: >60 mL/min (ref >60)
Glucose, Bld: 93 mg/dL (ref 70–99)
Potassium: 3.9 mmol/L (ref 3.5–5.1)
Sodium: 133 mmol/L — ABNORMAL LOW (ref 135–145)
Total Bilirubin: 0.8 mg/dL (ref 0.3–1.2)
Total Protein: 8.3 g/dL — ABNORMAL HIGH (ref 6.5–8.1)

## 2019-08-27 LAB — CBC
HCT: 41.3 % (ref 36.0–46.0)
Hemoglobin: 14 g/dL (ref 12.0–15.0)
MCH: 28.7 pg (ref 26.0–34.0)
MCHC: 33.9 g/dL (ref 30.0–36.0)
MCV: 84.8 fL (ref 80.0–100.0)
Platelets: 403 10*3/uL — ABNORMAL HIGH (ref 150–400)
RBC: 4.87 MIL/uL (ref 3.87–5.11)
RDW: 12.8 % (ref 11.5–15.5)
WBC: 15.7 10*3/uL — ABNORMAL HIGH (ref 4.0–10.5)
nRBC: 0 % (ref 0.0–0.2)

## 2019-08-27 LAB — POCT PREGNANCY, URINE: Preg Test, Ur: NEGATIVE

## 2019-08-27 LAB — LIPASE, BLOOD: Lipase: 29 U/L (ref 11–51)

## 2019-08-27 MED ORDER — SODIUM CHLORIDE 0.9% FLUSH: 3.0000 mL | Freq: Once | INTRAVENOUS | Status: DC

## 2019-08-27 MED ORDER — ONDANSETRON 4 MG PO TBDP: 4.0000 mg | ORAL_TABLET | Freq: Three times a day (TID) | ORAL | 0 refills | Status: DC | PRN

## 2019-08-27 MED ORDER — AMOXICILLIN 875 MG PO TABS: 875.0000 mg | ORAL_TABLET | Freq: Two times a day (BID) | ORAL | 0 refills | Status: DC

## 2019-08-27 MED ORDER — MAGIC MOUTHWASH W/LIDOCAINE: 5.0000 mL | Freq: Four times a day (QID) | ORAL | 0 refills | Status: DC

## 2019-08-27 NOTE — ED Triage Notes (Signed)
Lower abdominal pain intermittent x 2 weeks. Nausea with vomiting today.

## 2019-08-27 NOTE — ED Notes (Signed)
Pt to front desk, states that she is walking outside and will be back in

## 2019-08-27 NOTE — ED Notes (Signed)
Pt seen walking outside of ED. Pt does not appear to be in any distress at this time.

## 2019-08-27 NOTE — ED Notes (Signed)
Pt given water and crackers per EDP.

## 2019-08-27 NOTE — ED Provider Notes (Signed)
[facility] Emergency Department Provider Note  ____________________________________________  Time seen: Approximately 2:28 PM  I have reviewed the triage vital signs and the nursing notes.   HISTORY  Chief Complaint Emesis and Abdominal Pain    HPI Kathleen Hardy is a 33 y.o. female who presents the emergency department for evaluation of emesis today.  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  Patient reports that she felt fine but got overheated, started feeling nauseated and had one episode of emesis.  This was nonbilious.  No abdominal pain.  No diarrhea or constipation.  No urinary symptoms.  Patient states that she has been dealing with a possible dental infection to the left lower dentition but had no other complaints at this time.  Patient states that she feels perfectly fine at this time but was sent to the emergency department for evaluation as this occurred at work.         Past Medical History:  Diagnosis Date  . PCOS (polycystic ovarian syndrome)     Patient Active Problem List   Diagnosis Date Noted  . Breast pain, left 08/20/2017    Past Surgical History:  Procedure Laterality Date  . WISDOM TOOTH EXTRACTION      Prior to Admission medications   Medication Sig Start Date End Date Taking? Authorizing Provider  albuterol (PROVENTIL HFA;VENTOLIN HFA) 108 (90 Base) MCG/ACT inhaler Inhale 2 puffs into the lungs every 6 (six) hours as needed for wheezing or shortness of breath. 01/12/17   Enid Derry, PA-C  amoxicillin (AMOXIL) 875 MG tablet Take 1 tablet (875 mg total) by mouth 2 (two) times daily. 08/27/19   , Delorise Royals, PA-C  brompheniramine-pseudoephedrine-DM 30-2-10 MG/5ML syrup Take 5 mLs by mouth 4 (four) times daily as needed. Mix with 5 mL of viscous lidocaine for swish and swallow. 05/23/19   Joni Reining, PA-C  lidocaine (XYLOCAINE) 2 % solution Use as directed 5 mLs in  the mouth or throat every 6 (six) hours as needed for mouth pain. Mix with 5 mL of Bromfed-DM for swish and swallow. 05/23/19   Joni Reining, PA-C  magic mouthwash w/lidocaine SOLN Take 5 mLs by mouth 4 (four) times daily. 08/27/19   , Delorise Royals, PA-C  ondansetron (ZOFRAN-ODT) 4 MG disintegrating tablet Take 1 tablet (4 mg total) by mouth every 8 (eight) hours as needed for nausea or vomiting. 08/27/19   , Delorise Royals, PA-C  oxyCODONE-acetaminophen (PERCOCET) 5-325 MG tablet Take 1 tablet by mouth every 4 (four) hours as needed for severe pain. 04/19/19 04/18/20  Arnaldo Natal, MD  penicillin v potassium (VEETID) 500 MG tablet Take 1 tablet (500 mg total) by mouth 4 (four) times daily. 04/19/19   Arnaldo Natal, MD    Allergies Shellfish allergy  Family History  Problem Relation Age of Onset  . Breast cancer Maternal Grandmother 21       great grandmother  . Colon cancer Neg Hx     Social History Social History   Tobacco Use  . Smoking status: Current Every Day Smoker    Packs/day: 0.50    Years: 10.00    Pack years: 5.00    Types: Cigarettes  . Smokeless tobacco: Never Used  Substance Use Topics  . Alcohol use: Yes    Comment: occasionally  . Drug use: Yes    Types: Marijuana     Review of Systems  Constitutional: No fever/chills Eyes:  No visual changes. No discharge ENT: Positive for left dental pain Cardiovascular: no chest pain. Respiratory: no cough. No SOB. Gastrointestinal: No abdominal pain.  Positive for nausea and 1 episode of emesis.  No diarrhea.  No constipation. Genitourinary: Negative for dysuria. No hematuria Musculoskeletal: Negative for musculoskeletal pain. Skin: Negative for rash, abrasions, lacerations, ecchymosis. Neurological: Negative for headaches, focal weakness or numbness. 10-point ROS otherwise negative.  ____________________________________________   PHYSICAL EXAM:  VITAL SIGNS: ED Triage Vitals  Enc Vitals Group      BP 08/27/19 1012 130/84     Pulse Rate 08/27/19 1012 67     Resp 08/27/19 1012 16     Temp 08/27/19 1012 97.8 F (36.6 C)     Temp Source 08/27/19 1012 Oral     SpO2 08/27/19 1012 99 %     Weight 08/27/19 1025 185 lb (83.9 kg)     Height 08/27/19 1025 5\' 7"  (1.702 m)     Head Circumference --      Peak Flow --      Pain Score 08/27/19 1025 1     Pain Loc --      Pain Edu? --      Excl. in [place]? --      Constitutional: Alert and oriented. Well appearing and in no acute distress. Eyes: Conjunctivae are normal. PERRL. EOMI. Head: Atraumatic. ENT:      Ears:       Nose: No congestion/rhinnorhea.      Mouth/Throat: Mucous membranes are moist.  Visualization of the oral cavity reveals mild erythema around first molar left lower dentition.  No gross signs of abscess.  No drainage from the site.  Full range of motion to the mandible.  Uvula is midline.  Sub-lingular space is soft.  No edema to the neck. Neck: No stridor.  Hematological/Lymphatic/Immunilogical: No cervical lymphadenopathy. Cardiovascular: Normal rate, regular rhythm. Normal S1 and S2.  Good peripheral circulation. Respiratory: Normal respiratory effort without tachypnea or retractions. Lungs CTAB. Good air entry to the bases with no decreased or absent breath sounds. Gastrointestinal: Bowel sounds 4 quadrants. Soft and nontender to palpation. No guarding or rigidity. No palpable masses. No distention. No CVA tenderness. Musculoskeletal: Full range of motion to all extremities. No gross deformities appreciated. Neurologic:  Normal speech and language. No gross focal neurologic deficits are appreciated.  Skin:  Skin is warm, dry and intact. No rash noted. Psychiatric: Mood and affect are normal. Speech and behavior are normal. Patient exhibits appropriate insight and judgement.   ____________________________________________   LABS (all labs ordered are listed, but only abnormal results are displayed)  Labs Reviewed   COMPREHENSIVE METABOLIC PANEL - Abnormal; Notable for the following components:      Result Value   Sodium 133 (*)    Total Protein 8.3 (*)    All other components within normal limits  CBC - Abnormal; Notable for the following components:   WBC 15.7 (*)    Platelets 403 (*)    All other components within normal limits  URINALYSIS, COMPLETE (UACMP) WITH MICROSCOPIC - Abnormal; Notable for the following components:   Color, Urine YELLOW (*)    APPearance CLEAR (*)    All other components within normal limits  LIPASE, BLOOD  POC URINE PREG, ED  POCT PREGNANCY, URINE   ____________________________________________  EKG   ____________________________________________  RADIOLOGY   No results found.  ____________________________________________    PROCEDURES  Procedure(s) performed:    Procedures    Medications - No  data to display   ____________________________________________   INITIAL IMPRESSION / ASSESSMENT AND PLAN / ED COURSE  Pertinent labs & imaging results that were available during my care of the patient were reviewed by me and considered in my medical decision making (see chart for details).  Review of the [place] CSRS was performed in accordance of the [facility] prior to dispensing any controlled drugs.           Patient's diagnosis is consistent with emesis, dental infection.  Patient presented to the emergency department for evaluation of one episode of emesis.  Patient was working, became overheated and had some nausea which led to one episode of emesis.  As this occurred at work she was referred to the emergency department for evaluation.  Patient denies any ongoing emesis.  No abdominal pain.  Patient with no indication for imaging of the abdomen.  Patient will be prescribed Zofran for any return of symptoms, however I feel that this is likely attributable to overheat injury.  Patient did have an elevated white blood cell count.  She reports that she has  been dealing with some left lower dental pain.  Mild signs of left lower dental infection with no indication of abscess.  No indication for imaging in this region.  No indication for incision and drainage of any appreciable abscess in the oral cavity.  No evidence of Lemierre's or Ludwick's angina.  Patient be placed on antibiotics and Magic mouthwash for dental complaint.  Follow-up primary care as needed.  Patient may return to work tomorrow with no restrictions..  Patient is given ED precautions to return to the ED for any worsening or new symptoms.     ____________________________________________  FINAL CLINICAL IMPRESSION(S) / ED DIAGNOSES  Final diagnoses:  Non-intractable vomiting with nausea, unspecified vomiting type  Heat effects of, initial encounter  Dental infection      NEW MEDICATIONS STARTED DURING THIS VISIT:  ED Discharge Orders         Ordered    amoxicillin (AMOXIL) 875 MG tablet  2 times daily     08/27/19 1442    magic mouthwash w/lidocaine SOLN  4 times daily    Note to Pharmacy: Dispense in a 1/1/1 ratio. Use lidocaine, diphenhydramine, prednisolone   08/27/19 1442    ondansetron (ZOFRAN-ODT) 4 MG disintegrating tablet  Every 8 hours PRN     08/27/19 1442              This chart was dictated using voice recognition software/Dragon. Despite best efforts to proofread, errors can occur which can change the meaning. Any change was purely unintentional.    Racheal PatchesCuthriell,  D, PA-C 08/27/19 1444    Dionne BucySiadecki, Sebastian, MD 08/27/19 1551

## 2020-02-20 ENCOUNTER — Ambulatory Visit: Payer: Self-pay | Attending: Internal Medicine

## 2020-02-20 DIAGNOSIS — Z23 Encounter for immunization: Secondary | ICD-10-CM

## 2020-02-20 NOTE — Progress Notes (Signed)
   Covid-19 Vaccination Clinic  Name:  Kathleen Hardy    MRN: [id number] DOB: 02-24-1986  02/20/2020  [name] was observed post Covid-19 immunization for 15 minutes without incident. She was provided with Vaccine Information Sheet and instruction to access the V-Safe system.   [name] was instructed to call 911 with any severe reactions post vaccine: Marland Kitchen Difficulty breathing  . Swelling of face and throat  . A fast heartbeat  . A bad rash all over body  . Dizziness and weakness   Immunizations Administered    Name Date Dose VIS Date Route   Pfizer COVID-19 Vaccine 02/20/2020  2:45 PM 0.3 mL 11/04/2019 Intramuscular   Manufacturer: [employer], [employer]   Lot: [id number]   NDC: [address]-0

## 2020-03-12 ENCOUNTER — Ambulatory Visit: Payer: Self-pay | Attending: Internal Medicine

## 2020-03-12 DIAGNOSIS — Z23 Encounter for immunization: Secondary | ICD-10-CM

## 2020-03-12 NOTE — Progress Notes (Signed)
   Covid-19 Vaccination Clinic  Name:  Kathleen Hardy    MRN: [id number] DOB: 1986-04-13  03/12/2020  [name] was observed post Covid-19 immunization for 15 minutes without incident. She was provided with Vaccine Information Sheet and instruction to access the V-Safe system.   [name] was instructed to call 911 with any severe reactions post vaccine: Marland Kitchen Difficulty breathing  . Swelling of face and throat  . A fast heartbeat  . A bad rash all over body  . Dizziness and weakness   Immunizations Administered    Name Date Dose VIS Date Route   Pfizer COVID-19 Vaccine 03/12/2020  2:44 PM 0.3 mL 01/18/2019 Intramuscular   Manufacturer: [employer], [employer]   Lot: [id number]   NDC: [address]-5

## 2020-09-14 ENCOUNTER — Ambulatory Visit: Payer: Self-pay | Admitting: Family Medicine

## 2020-09-14 ENCOUNTER — Encounter: Payer: Self-pay | Admitting: Family Medicine

## 2020-09-14 ENCOUNTER — Other Ambulatory Visit: Payer: Self-pay | Admitting: Family Medicine

## 2020-09-14 ENCOUNTER — Other Ambulatory Visit: Payer: Self-pay

## 2020-09-14 DIAGNOSIS — Z113 Encounter for screening for infections with a predominantly sexual mode of transmission: Secondary | ICD-10-CM

## 2020-09-14 LAB — WET PREP FOR TRICH, YEAST, CLUE
Trichomonas Exam: NEGATIVE
Yeast Exam: NEGATIVE

## 2020-09-14 NOTE — Progress Notes (Signed)
[facility] Department STI clinic/screening visit  Subjective:  Kathleen Hardy is a 34 y.o. female being seen today for an STI screening visit. The patient reports they do not have symptoms.  Patient reports that they do desire a pregnancy in the next year.   They reported they are not interested in discussing contraception today.  Patient's last menstrual period was 09/02/2020 (exact date).   Patient has the following medical conditions:   Patient Active Problem List   Diagnosis Date Noted  . Breast pain, left 08/20/2017    Chief Complaint  Patient presents with  . SEXUALLY TRANSMITTED DISEASE    screening     HPI  Patient reports no having symptoms and wanting "just to be checked"  Patient reports wanting to be pregnant and is taking PNV currently.    Last HIV test per patient/review of record was 02/2020 Patient reports last pap was 02/2020  See flowsheet for further details and programmatic requirements.    The following portions of the patient's history were reviewed and updated as appropriate: allergies, current medications, past medical history, past social history, past surgical history and problem list.  Objective:  There were no vitals filed for this visit.  Physical Exam Constitutional:      Appearance: Normal appearance.  HENT:     Head: Normocephalic.     Comments: In scalp, brows and lashes: no nits, no hair loss    Mouth/Throat:     Mouth: Mucous membranes are moist.     Pharynx: Oropharynx is clear. No oropharyngeal exudate or posterior oropharyngeal erythema.  Abdominal:     General: Abdomen is flat.     Palpations: Abdomen is soft. There is no mass.     Tenderness: There is no abdominal tenderness. There is no guarding or rebound.  Genitourinary:    Comments: External genitalia without, lice, nits, erythema, edema , lesions or inguinal adenopathy. Vagina with normal mucosa and white discharge pooled in the vaginal and pH equals 4.  Cervix  without visual lesions, uterus firm, mobile, non-tender, no masses, CMT adnexal fullness or tenderness.  Musculoskeletal:     Cervical back: Normal range of motion and neck supple.  Lymphadenopathy:     Cervical: No cervical adenopathy.  Skin:    General: Skin is warm and dry.     Findings: No bruising, erythema, lesion or rash.  Neurological:     Mental Status: She is alert.  Psychiatric:        Mood and Affect: Mood normal.        Behavior: Behavior normal.      Assessment and Plan:  Kathleen Hardy is a 34 y.o. female presenting to the [facility] Department for STI screening  1. Screening examination for venereal disease - HBV Antigen/Antibody [facility] - HIV/HCV [place] [facility] - Syphilis Serology, [place] [facility] - WET PREP FOR TRICH, YEAST, CLUE - Chlamydia/Gonorrhea [place] [facility]   Patient accepted all screenings including oral GC, vaginal CT/GC and bloodwork for HIV, RPR, HCV, HBV.  Patient meets criteria for HepB screening? Yes. Ordered? Yes Patient meets criteria for HepC screening? Yes. Ordered? Yes   Treat Wet prep per standing order.  Patient did not report symptoms.   Discussed time line for State Lab results and that patient will be called with positive results and encouraged patient to call if she had not heard in 2 weeks.   Counseled to return or seek care for any new symptoms.  Recommended condom use  with new sexual partners .    Please give 1-800- quitline info. and health pregnancy information.  Discussed health conception with patient.   Patient is currently desiring pregnancy.    No follow-ups on file.  No future appointments. Patient to call if questions or concerns.   Wendi Snipes, FNP

## 2020-09-14 NOTE — Addendum Note (Signed)
Addended by: Wendi Snipes on: 09/14/2020 03:19 PM   Modules accepted: Orders

## 2020-09-14 NOTE — Progress Notes (Signed)
Post:  RN discussed wet mount with patient. No tx per S.O. RN provided patient with quit line for tobacco use and healthy pregnancy literature. All questions answered. Provider orders complete.   Harvie Heck, RN

## 2020-09-18 LAB — GONOCOCCUS CULTURE

## 2020-11-30 ENCOUNTER — Ambulatory Visit: Payer: BLUE CROSS/BLUE SHIELD

## 2020-12-07 ENCOUNTER — Ambulatory Visit: Payer: BLUE CROSS/BLUE SHIELD

## 2020-12-17 ENCOUNTER — Other Ambulatory Visit: Payer: Self-pay

## 2020-12-17 ENCOUNTER — Ambulatory Visit: Payer: BLUE CROSS/BLUE SHIELD | Admitting: Physician Assistant

## 2020-12-17 ENCOUNTER — Encounter: Payer: Self-pay | Admitting: Physician Assistant

## 2020-12-17 DIAGNOSIS — Z113 Encounter for screening for infections with a predominantly sexual mode of transmission: Secondary | ICD-10-CM | POA: Diagnosis not present

## 2020-12-17 LAB — WET PREP FOR TRICH, YEAST, CLUE
Trichomonas Exam: NEGATIVE
Yeast Exam: NEGATIVE

## 2020-12-17 NOTE — Progress Notes (Signed)
[facility] Department STI clinic/screening visit  Subjective:  Kathleen Hardy is a 35 y.o. female being seen today for an STI screening visit. The patient reports they do not have symptoms.  Patient reports that they do not desire a pregnancy in the next year.   They reported they are not interested in discussing contraception today.  Patient's last menstrual period was 11/29/2020.   Patient has the following medical conditions:   Patient Active Problem List   Diagnosis Date Noted  . Breast pain, left 08/20/2017    Chief Complaint  Patient presents with  . SEXUALLY TRANSMITTED DISEASE    screening    HPI  Patient reports that she is not having any symptoms but would like a screening.  States that she has a new partner.  Denies chronic conditions and regular medicines.  States last HIV and Hepatitis screening was 08/2020 and last pap was 02/2020.  States she is using condoms as her BCM.   See flowsheet for further details and programmatic requirements.    The following portions of the patient's history were reviewed and updated as appropriate: allergies, current medications, past medical history, past social history, past surgical history and problem list.  Objective:  There were no vitals filed for this visit.  Physical Exam Constitutional:      General: She is not in acute distress.    Appearance: Normal appearance.  HENT:     Head: Normocephalic and atraumatic.     Comments: No nits,lice, or hair loss. No cervical, supraclavicular or axillary adenopathy.    Mouth/Throat:     Mouth: Mucous membranes are moist.     Pharynx: Oropharynx is clear. No oropharyngeal exudate or posterior oropharyngeal erythema.  Eyes:     Conjunctiva/sclera: Conjunctivae normal.  Pulmonary:     Effort: Pulmonary effort is normal.  Abdominal:     Palpations: Abdomen is soft. There is no mass.     Tenderness: There is no abdominal tenderness. There is no guarding or rebound.   Genitourinary:    General: Normal vulva.     Rectum: Normal.     Comments: External genitalia/pubic area without nits, lice, edema, erythema, lesions and inguinal adenopathy. Vagina with normal mucosa and discharge. Cervix without visible lesions. Uterus firm, mobile, nt, no masses, no CMT, no adnexal tenderness or fullness. Musculoskeletal:     Cervical back: Neck supple. No tenderness.  Skin:    General: Skin is warm and dry.     Findings: No bruising, erythema, lesion or rash.  Neurological:     Mental Status: She is alert and oriented to person, place, and time.  Psychiatric:        Mood and Affect: Mood normal.        Behavior: Behavior normal.        Thought Content: Thought content normal.        Judgment: Judgment normal.      Assessment and Plan:  Kathleen Hardy is a 35 y.o. female presenting to the [facility] Department for STI screening  1. Screening for STD (sexually transmitted disease) Patient into clinic without symptoms. Rec condoms with all sex. Await test results.  Counseled that RN will call if needs to RTC for treatment once results are back. - WET PREP FOR TRICH, YEAST, CLUE - Gonococcus culture - Chlamydia/Gonorrhea [place] [facility] - HIV [place] [facility] - Syphilis Serology, [place] [facility]     No follow-ups on file.  No future appointments.  Marylynn Pearson  [place], [place]

## 2020-12-17 NOTE — Progress Notes (Signed)
Post:  RN reviewed wet mount with patient, no Tx per S.O. Provider orders complete.  Harvie Heck, RN

## 2020-12-22 LAB — GONOCOCCUS CULTURE

## 2020-12-26 ENCOUNTER — Telehealth: Payer: Self-pay

## 2020-12-26 NOTE — Telephone Encounter (Signed)
Call to patient to inform her of positive chlamydia results from 12/17/2020. No answer, LMOM to return call.   Harvie Heck, RN

## 2020-12-27 NOTE — Telephone Encounter (Signed)
Call to patient to discuss TR. No answer. LMOM. Patient was already schedule for std appt tomorrow.   Harvie Heck, RN

## 2020-12-28 ENCOUNTER — Other Ambulatory Visit: Payer: Self-pay

## 2020-12-28 ENCOUNTER — Ambulatory Visit: Payer: Self-pay

## 2020-12-28 DIAGNOSIS — A749 Chlamydial infection, unspecified: Secondary | ICD-10-CM

## 2020-12-28 MED ORDER — DOXYCYCLINE HYCLATE 100 MG PO TABS: 100.0000 mg | ORAL_TABLET | Freq: Two times a day (BID) | ORAL | 0 refills | Status: AC

## 2020-12-28 NOTE — Progress Notes (Addendum)
Consult with Sadie Haber, PA regarding Last sex was 12/16/2020 with a condom and LMP started 12/24/2020. Per Albin Felling:  Ok to treat Chlamydia with Doxycycline 100 mg BID x 7 days. Hart Carwin, RN

## 2020-12-29 NOTE — Progress Notes (Signed)
Consulted by RN re: patient situation.  Reviewed RN note and agree that it reflects our discussion and my recommendations. 

## 2021-01-15 NOTE — Addendum Note (Signed)
Addended by: Heywood Bene on: 01/15/2021 01:39 PM   Modules accepted: Orders

## 2021-01-31 ENCOUNTER — Emergency Department
Admission: EM | Admit: 2021-01-31 | Discharge: 2021-01-31 | Disposition: A | Discharge status: Home / Self Care | Payer: BC Managed Care – PPO | Attending: Emergency Medicine | Admitting: Emergency Medicine

## 2021-01-31 ENCOUNTER — Other Ambulatory Visit: Payer: Self-pay

## 2021-01-31 DIAGNOSIS — Z20822 Contact with and (suspected) exposure to covid-19: Secondary | ICD-10-CM | POA: Insufficient documentation

## 2021-01-31 DIAGNOSIS — F17210 Nicotine dependence, cigarettes, uncomplicated: Secondary | ICD-10-CM | POA: Insufficient documentation

## 2021-01-31 DIAGNOSIS — J029 Acute pharyngitis, unspecified: Secondary | ICD-10-CM | POA: Insufficient documentation

## 2021-01-31 LAB — GROUP A STREP BY PCR: Group A Strep by PCR: NOT DETECTED

## 2021-01-31 LAB — RESP PANEL BY RT-PCR (FLU A&B, COVID) ARPGX2
Influenza A by PCR: NEGATIVE
Influenza B by PCR: NEGATIVE
SARS Coronavirus 2 by RT PCR: NEGATIVE

## 2021-01-31 MED ORDER — CETIRIZINE-PSEUDOEPHEDRINE ER 5-120 MG PO TB12: 1.0000 | ORAL_TABLET | Freq: Two times a day (BID) | ORAL | 0 refills | Status: DC

## 2021-01-31 NOTE — Discharge Instructions (Signed)
Primary care provider if any continued problems or concerns.  Follow-up with Dr. Andee Poles who is on-call for [facility] if any continued problems or worsening of your symptoms.  Increase fluids.  Take Tylenol or ibuprofen as needed for throat pain.  A prescription for an antihistamine decongestant was sent to your pharmacy along with some viscous lidocaine to help with throat pain.  The lidocaine will numb up your throat which sometimes feels weird but will help with pain.

## 2021-01-31 NOTE — ED Notes (Signed)
See triage note  States she developed sore throat 2 days ago   States pain is worse today  Increased pain with swallowing no fever

## 2021-01-31 NOTE — ED Triage Notes (Signed)
Pt comes with c/o sore throat for two days. Pt denies any other symptoms. Pt states pain to swallow.

## 2021-01-31 NOTE — ED Provider Notes (Signed)
[facility] Emergency Department Provider Note   ____________________________________________   Event Date/Time   First MD Initiated Contact with Patient 01/31/21 [phone]     (approximate)  I have reviewed the triage vital signs and the nursing notes.   HISTORY  Chief Complaint Sore Throat   HPI Kathleen Hardy is a 35 y.o. female presents to the ED with complaint of sore throat for the last 2 days.  Patient denies any fever, chills, nausea or vomiting.  Patient is unaware of any fever and denies chills.  She states it is painful to swallow.  She denies any change in taste or smell, or exposure to Covid.  Patient was vaccinated with [employer] x2.       Past Medical History:  Diagnosis Date  . PCOS (polycystic ovarian syndrome)     Patient Active Problem List   Diagnosis Date Noted  . Breast pain, left 08/20/2017    Past Surgical History:  Procedure Laterality Date  . WISDOM TOOTH EXTRACTION      Prior to Admission medications   Medication Sig Start Date End Date Taking? Authorizing Provider  cetirizine-pseudoephedrine (ZYRTEC-D) 5-120 MG tablet Take 1 tablet by mouth 2 (two) times daily. 01/31/21  Yes Bridget Hartshorn L, PA-C  albuterol (PROVENTIL HFA;VENTOLIN HFA) 108 (90 Base) MCG/ACT inhaler Inhale 2 puffs into the lungs every 6 (six) hours as needed for wheezing or shortness of breath. 01/12/17   Enid Derry, PA-C    Allergies Shellfish allergy  Family History  Problem Relation Age of Onset  . Breast cancer Maternal Grandmother 42       great grandmother  . Colon cancer Neg Hx     Social History Social History   Tobacco Use  . Smoking status: Current Every Day Smoker    Packs/day: 0.25    Years: 10.00    Pack years: 2.50    Types: Cigarettes  . Smokeless tobacco: Never Used  Vaping Use  . Vaping Use: Never used  Substance Use Topics  . Alcohol use: Yes    Alcohol/week: 3.0 standard drinks    Types: 2 Glasses of wine, 1  Standard drinks or equivalent per week    Comment: occasionally  . Drug use: Yes    Frequency: 7.0 times per week    Types: Marijuana    Review of Systems Constitutional: No fever/chills Eyes: No visual changes. ENT: Positive sore throat. Cardiovascular: Denies chest pain. Respiratory: Denies shortness of breath.  Negative for cough. Gastrointestinal: No abdominal pain.  No nausea, no vomiting.  No diarrhea.   Genitourinary: Negative for dysuria. Musculoskeletal: Negative for musculoskeletal pain. Skin: Negative for rash. Neurological: Negative for headaches, focal weakness or numbness. ____________________________________________   PHYSICAL EXAM:  VITAL SIGNS: ED Triage Vitals [01/31/21 0756]  Enc Vitals Group     BP (!) 144/86     Pulse Rate 70     Resp 18     Temp 98.6 F (37 C)     Temp src      SpO2 100 %     Weight      Height      Head Circumference      Peak Flow      Pain Score      Pain Loc      Pain Edu?      Excl. in GC?    Constitutional: Alert and oriented. Well appearing and in no acute distress. Eyes: Conjunctivae are normal.  Head: Atraumatic. Nose: No congestion/rhinnorhea.  Mouth/Throat: Mucous membranes are moist.  Oropharynx non-erythematous.  No exudate present.  Uvula is midline. Neck: No stridor.   Cardiovascular: Normal rate, regular rhythm. Grossly normal heart sounds.  Good peripheral circulation. Respiratory: Normal respiratory effort.  No retractions. Lungs CTAB. Gastrointestinal: Soft and nontender. No distention. Musculoskeletal: Moves upper and lower extremities they have difficulty normal gait was noted. Neurologic:  Normal speech and language. No gross focal neurologic deficits are appreciated. No gait instability. Skin:  Skin is warm, dry and intact. No rash noted. Psychiatric: Mood and affect are normal. Speech and behavior are normal.  ____________________________________________   LABS (all labs ordered are listed, but  only abnormal results are displayed)  Labs Reviewed  GROUP A STREP BY PCR  RESP PANEL BY RT-PCR (FLU A&B, COVID) [id number]    PROCEDURES  Procedure(s) performed (including Critical Care):  Procedures   ____________________________________________   INITIAL IMPRESSION / ASSESSMENT AND PLAN / ED COURSE  As part of my medical decision making, I reviewed the following data within the electronic MEDICAL RECORD NUMBER Notes from prior ED visits and [place] Controlled Substance Database  35 year old female presents to the ED with complaint of sore throat for the last 2 days.  Patient has been vaccinated with [employer] x2.  She denies any known Covid exposure or symptoms of COVID besides the sore throat.  Strep, Covid and influenza were negative.  Patient was made aware.  A prescription for Zyrtec-D was sent to her pharmacy.  She is encouraged to drink lots of fluids and use saline nose spray if needed for nasal congestion.  Tylenol or ibuprofen for her throat pain.  She is to follow-up with her PCP if any continued problems or Dr. Andee Poles who is on-call for [facility].  ____________________________________________   FINAL CLINICAL IMPRESSION(S) / ED DIAGNOSES  Final diagnoses:  Viral pharyngitis     ED Discharge Orders         Ordered    cetirizine-pseudoephedrine (ZYRTEC-D) 5-120 MG tablet  2 times daily        01/31/21 2035          *Please note:  Kathleen Hardy was evaluated in Emergency Department on 01/31/2021 for the symptoms described in the history of present illness. She was evaluated in the context of the global COVID-19 pandemic, which necessitated consideration that the patient might be at risk for infection with the SARS-CoV-2 virus that causes COVID-19. Institutional protocols and algorithms that pertain to the evaluation of patients at risk for COVID-19 are in a state of rapid change based on information released by regulatory bodies including the CDC and federal and state  organizations. These policies and algorithms were followed during the patient's care in the ED.  Some ED evaluations and interventions may be delayed as a result of limited staffing during and the pandemic.*   Note:  This document was prepared using Dragon voice recognition software and may include unintentional dictation errors.    Tommi Rumps, PA-C 01/31/21 1113    Shaune Pollack, MD 02/01/21 [phone]

## 2021-02-14 ENCOUNTER — Other Ambulatory Visit: Payer: Self-pay

## 2021-02-14 ENCOUNTER — Ambulatory Visit: Payer: Self-pay | Admitting: Physician Assistant

## 2021-02-14 DIAGNOSIS — Z7189 Other specified counseling: Secondary | ICD-10-CM

## 2021-02-14 DIAGNOSIS — Z299 Encounter for prophylactic measures, unspecified: Secondary | ICD-10-CM

## 2021-02-14 DIAGNOSIS — A5619 Other chlamydial genitourinary infection: Secondary | ICD-10-CM | POA: Insufficient documentation

## 2021-02-14 DIAGNOSIS — Z113 Encounter for screening for infections with a predominantly sexual mode of transmission: Secondary | ICD-10-CM

## 2021-02-14 LAB — WET PREP FOR TRICH, YEAST, CLUE
Trichomonas Exam: NEGATIVE
Yeast Exam: NEGATIVE

## 2021-02-14 MED ORDER — DOXYCYCLINE HYCLATE 100 MG PO TABS: 100.0000 mg | ORAL_TABLET | Freq: Two times a day (BID) | ORAL | 0 refills | Status: AC

## 2021-02-14 MED ORDER — CEFTRIAXONE SODIUM 500 MG IJ SOLR
500.0000 mg | Freq: Once | INTRAMUSCULAR | Status: AC
  Administered 2021-02-14: 500 mg via INTRAMUSCULAR

## 2021-02-15 ENCOUNTER — Encounter: Payer: Self-pay | Admitting: Physician Assistant
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
## 2021-02-20 LAB — HM HEPATITIS C SCREENING LAB: HM Hepatitis Screen: NEGATIVE

## 2021-02-20 LAB — HM HIV SCREENING LAB: HM HIV Screening: NEGATIVE

## 2021-04-09 ENCOUNTER — Encounter: Payer: Self-pay | Admitting: Advanced Practice Midwife

## 2021-04-09 ENCOUNTER — Ambulatory Visit: Payer: BLUE CROSS/BLUE SHIELD

## 2021-04-09 ENCOUNTER — Ambulatory Visit: Payer: Self-pay | Admitting: Advanced Practice Midwife

## 2021-04-09 ENCOUNTER — Other Ambulatory Visit: Payer: Self-pay

## 2021-04-09 DIAGNOSIS — F339 Major depressive disorder, recurrent, unspecified: Secondary | ICD-10-CM | POA: Insufficient documentation

## 2021-04-09 DIAGNOSIS — Z113 Encounter for screening for infections with a predominantly sexual mode of transmission: Secondary | ICD-10-CM

## 2021-04-09 DIAGNOSIS — T7421XS Adult sexual abuse, confirmed, sequela: Secondary | ICD-10-CM

## 2021-04-09 DIAGNOSIS — T7411XA Adult physical abuse, confirmed, initial encounter: Secondary | ICD-10-CM | POA: Insufficient documentation

## 2021-04-09 DIAGNOSIS — Z720 Tobacco use: Secondary | ICD-10-CM

## 2021-04-09 DIAGNOSIS — F1290 Cannabis use, unspecified, uncomplicated: Secondary | ICD-10-CM | POA: Insufficient documentation

## 2021-04-09 DIAGNOSIS — F17200 Nicotine dependence, unspecified, uncomplicated: Secondary | ICD-10-CM

## 2021-04-09 DIAGNOSIS — T7421XA Adult sexual abuse, confirmed, initial encounter: Secondary | ICD-10-CM | POA: Insufficient documentation

## 2021-04-09 DIAGNOSIS — T7411XS Adult physical abuse, confirmed, sequela: Secondary | ICD-10-CM

## 2021-04-09 LAB — WET PREP FOR TRICH, YEAST, CLUE
Trichomonas Exam: NEGATIVE
Yeast Exam: NEGATIVE

## 2021-04-09 NOTE — Progress Notes (Signed)
[facility] Department STI clinic/screening visit  Subjective:  Kathleen Hardy is a 35 y.o. SBF nullip smoker female being seen today for an STI screening visit. The patient reports they do have symptoms.  Patient reports that they do not desire a pregnancy in the next year.   They reported they are not interested in discussing contraception today.  Patient's last menstrual period was 03/19/2021 (exact date).   Patient has the following medical conditions:   Patient Active Problem List   Diagnosis Date Noted  . Morbid obesity (HCC) 185 lbs 04/09/2021  . Marijuana use 04/09/2021  . Smoker 7-15 cpd 04/09/2021  . Depression, recurrent dx'd 2011 04/09/2021  . Chlamydia trachomatis infection of other genitourinary sites 02/14/2021  . Breast pain, left 08/20/2017    Chief Complaint  Patient presents with  . SEXUALLY TRANSMITTED DISEASE    HPI  Patient reports clear-white thick d/c past 3-4 days. Last sex 03/31/21 without condom; with current partner x 4 mo; 2 sex partners in last 3 mo. LMP 03/19/21. Last MJ today. Smoking 7-15 cpd. Last vaped 04/07/21. Last cigar 6 years ago. Last ETOH 03/31/21 (3 shots Tequila).  Last HIV test per patient/review of record was 02/20/21 Patient reports last pap was 02/2020  See flowsheet for further details and programmatic requirements.    The following portions of the patient's history were reviewed and updated as appropriate: allergies, current medications, past medical history, past social history, past surgical history and problem list.  Objective:  There were no vitals filed for this visit.  Physical Exam Vitals and nursing note reviewed.  Constitutional:      Appearance: Normal appearance.  HENT:     Head: Normocephalic and atraumatic.     Comments: Negative cervical lymphadenopathy    Mouth/Throat:     Mouth: Mucous membranes are moist.     Pharynx: Oropharynx is clear. No oropharyngeal exudate or posterior oropharyngeal erythema.   Eyes:     Conjunctiva/sclera: Conjunctivae normal.  Pulmonary:     Effort: Pulmonary effort is normal.  Chest:  Breasts:     Right: No axillary adenopathy or supraclavicular adenopathy.     Left: No axillary adenopathy or supraclavicular adenopathy.    Abdominal:     Palpations: Abdomen is soft. There is no mass.     Tenderness: There is no abdominal tenderness. There is no rebound.     Comments: Soft without masses or tenderness  Genitourinary:    General: Normal vulva.     Exam position: Lithotomy position.     Pubic Area: No rash or pubic lice.      Labia:        Right: No rash or lesion.        Left: No rash or lesion.      Vagina: Vaginal discharge (white creamy leukorrhea, ph<4.5) present. No erythema, bleeding or lesions.     Cervix: Normal.     Uterus: Normal.      Adnexa: Right adnexa normal and left adnexa normal.     Rectum: Normal.  Lymphadenopathy:     Head:     Right side of head: No preauricular or posterior auricular adenopathy.     Left side of head: No preauricular or posterior auricular adenopathy.     Cervical: No cervical adenopathy.     Upper Body:     Right upper body: No supraclavicular or axillary adenopathy.     Left upper body: No supraclavicular or axillary adenopathy.     Lower  Body: No right inguinal adenopathy. No left inguinal adenopathy.  Skin:    General: Skin is warm and dry.     Findings: No rash.  Neurological:     Mental Status: She is alert and oriented to person, place, and time.      Assessment and Plan:  Kathleen Hardy is a 35 y.o. female presenting to the [facility] Department for STI screening  1. Screening examination for venereal disease Treat wet mount per standing orders Immunization nurse consult - WET PREP FOR TRICH, YEAST, CLUE - Chlamydia/Gonorrhea [facility] - Gonococcus culture  2. Morbid obesity (HCC) 185 lbs  3. Marijuana use  4. Smoker 7-15 cpd Counseled via 5 A's to stop smoking  5.  Depression, recurrent dx'd 2011 Declines counseling     Return if symptoms worsen or fail to improve.  No future appointments.  Alberteen Spindle, CNM

## 2021-04-09 NOTE — Progress Notes (Signed)
Wet mount reviewed by Hazle Coca CNM and no treatment indicated. Jossie Ng, RN

## 2021-04-13 LAB — GONOCOCCUS CULTURE

## 2021-07-16 ENCOUNTER — Encounter: Payer: Self-pay | Admitting: Physician Assistant

## 2021-07-16 ENCOUNTER — Other Ambulatory Visit: Payer: Self-pay

## 2021-07-16 ENCOUNTER — Ambulatory Visit: Payer: Self-pay | Admitting: Physician Assistant

## 2021-07-16 DIAGNOSIS — Z113 Encounter for screening for infections with a predominantly sexual mode of transmission: Secondary | ICD-10-CM

## 2021-07-16 LAB — WET PREP FOR TRICH, YEAST, CLUE
Trichomonas Exam: NEGATIVE
Yeast Exam: NEGATIVE
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
## 2021-07-20 LAB — GONOCOCCUS CULTURE

## 2021-08-13 ENCOUNTER — Emergency Department
Admission: EM | Admit: 2021-08-13 | Discharge: 2021-08-13 | Disposition: A | Discharge status: Home / Self Care | Payer: BLUE CROSS/BLUE SHIELD | Attending: Emergency Medicine | Admitting: Emergency Medicine

## 2021-08-13 ENCOUNTER — Other Ambulatory Visit: Payer: Self-pay

## 2021-08-13 ENCOUNTER — Encounter: Payer: Self-pay | Admitting: Emergency Medicine

## 2021-08-13 DIAGNOSIS — J111 Influenza due to unidentified influenza virus with other respiratory manifestations: Secondary | ICD-10-CM

## 2021-08-13 DIAGNOSIS — F17210 Nicotine dependence, cigarettes, uncomplicated: Secondary | ICD-10-CM | POA: Insufficient documentation

## 2021-08-13 DIAGNOSIS — U071 COVID-19: Secondary | ICD-10-CM | POA: Diagnosis not present

## 2021-08-13 DIAGNOSIS — J029 Acute pharyngitis, unspecified: Secondary | ICD-10-CM | POA: Diagnosis present

## 2021-08-13 LAB — RESP PANEL BY RT-PCR (FLU A&B, COVID) ARPGX2
Influenza A by PCR: NEGATIVE
Influenza B by PCR: NEGATIVE
SARS Coronavirus 2 by RT PCR: POSITIVE — AB

## 2021-08-13 MED ORDER — NAPROXEN 500 MG PO TABS: 500.0000 mg | ORAL_TABLET | Freq: Two times a day (BID) | ORAL | 0 refills | Status: DC

## 2021-08-13 MED ORDER — PAXLOVID (300/100) 20 X 150 MG & 10 X 100MG PO TBPK: 3.0000 | ORAL_TABLET | Freq: Two times a day (BID) | ORAL | 0 refills | Status: AC

## 2021-08-13 MED ORDER — FAMOTIDINE 20 MG PO TABS
40.0000 mg | ORAL_TABLET | Freq: Once | ORAL | Status: AC
  Administered 2021-08-13: 40 mg via ORAL
  Filled 2021-08-13: qty 2

## 2021-08-13 MED ORDER — KETOROLAC TROMETHAMINE 60 MG/2ML IM SOLN
15.0000 mg | Freq: Once | INTRAMUSCULAR | Status: AC
  Administered 2021-08-13: 15 mg via INTRAMUSCULAR
  Filled 2021-08-13: qty 2

## 2021-08-13 MED ORDER — FAMOTIDINE 20 MG PO TABS: 20.0000 mg | ORAL_TABLET | Freq: Two times a day (BID) | ORAL | 0 refills | Status: DC

## 2021-08-13 MED ORDER — ONDANSETRON 8 MG PO TBDP
8.0000 mg | ORAL_TABLET | Freq: Once | ORAL | Status: AC
  Administered 2021-08-13: 8 mg via ORAL
  Filled 2021-08-13: qty 1

## 2021-08-13 MED ORDER — ALUM & MAG HYDROXIDE-SIMETH 200-200-20 MG/5ML PO SUSP
30.0000 mL | Freq: Once | ORAL | Status: AC
  Administered 2021-08-13: 30 mL via ORAL
  Filled 2021-08-13: qty 30

## 2021-08-13 MED ORDER — ONDANSETRON 4 MG PO TBDP: 4.0000 mg | ORAL_TABLET | Freq: Three times a day (TID) | ORAL | 0 refills | Status: DC | PRN

## 2021-08-13 NOTE — ED Notes (Signed)
See triage note  presents with low grade temp  sore throat and body aches  sxs' started 1-2 days ago
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
## 2021-08-13 NOTE — ED Notes (Signed)
Pt NAD, a/ox4. Pt verbalizes understanding of all DC and f/u instructions. All questions answered. Pt walks with steady gait to lobby at DC.  ? ?

## 2021-08-13 NOTE — ED Triage Notes (Signed)
C/O sore throat, body aches, chills since Sunday.  AAOx3.  Skin warm and dry. NAD

## 2021-09-17 ENCOUNTER — Inpatient Hospital Stay
Admission: RE | Admit: 2021-09-17 | Discharge: 2021-09-17 | Disposition: A | Discharge status: Home / Self Care | Payer: Self-pay | Source: Ambulatory Visit | Attending: *Deleted | Admitting: *Deleted

## 2021-09-17 ENCOUNTER — Other Ambulatory Visit: Payer: Self-pay | Admitting: *Deleted

## 2021-09-17 DIAGNOSIS — Z1231 Encounter for screening mammogram for malignant neoplasm of breast: Secondary | ICD-10-CM

## 2021-09-18 ENCOUNTER — Ambulatory Visit
Admission: RE | Admit: 2021-09-18 | Discharge: 2021-09-18 | Disposition: A | Discharge status: Home / Self Care | Payer: BLUE CROSS/BLUE SHIELD | Source: Ambulatory Visit | Attending: Oncology | Admitting: Oncology

## 2021-09-18 ENCOUNTER — Other Ambulatory Visit: Payer: Self-pay

## 2021-09-18 ENCOUNTER — Ambulatory Visit: Payer: BLUE CROSS/BLUE SHIELD | Attending: Oncology

## 2021-09-18 VITALS — BP 119/81 | HR 78 | Temp 97.9°F

## 2021-09-18 DIAGNOSIS — N63 Unspecified lump in unspecified breast: Secondary | ICD-10-CM | POA: Insufficient documentation

## 2021-09-18 NOTE — Progress Notes (Signed)
  Subjective:     Patient ID: Kathleen Hardy, female   DOB: Oct 19, 1986, 35 y.o.   MRN: [id number]  HPI   Review of Systems     Objective:   Physical Exam Chest:  Breasts:    Right: Mass present. No swelling, bleeding, inverted nipple, nipple discharge, skin change or tenderness.     Left: No swelling, bleeding, inverted nipple, mass, nipple discharge, skin change or tenderness.       Comments: Bilateral fibroglandular tissue outer breasts;  Palpable tender thickening upper inner right  breast below clavicle.        Assessment:     35 year old patient presents for BCCCP clinic visit.  Patient screened, and meets BCCCP eligibility.  Patient does not have insurance, Medicare or Medicaid.  Instructed patient on breast self awareness using teach back method. Clinical breast exam reveals a thickening upper right  .  Patient reports pain on palpation. Easier to palpate with patient in sitting position. Symmetrical Bilateral outer breast fibroglandular tissue palpated.  Risk Assessment   No risk assessment data     Risk Assessment     Risk Scores       09/18/2021   Last edited by: Scarlett Presto, RN   5-year risk:    Lifetime risk:                Plan:     Sent for bilateral diagnostic mammogram, and ultrasound.

## 2021-09-18 NOTE — Progress Notes (Signed)
Letter mailed from [facility] to notify of normal mammogram results.  Patient to return in one year for annual screening.  Copy to [facility]. 

## 2021-12-31 ENCOUNTER — Encounter: Payer: Self-pay | Admitting: Family Medicine

## 2021-12-31 ENCOUNTER — Ambulatory Visit (LOCAL_COMMUNITY_HEALTH_CENTER): Payer: Self-pay | Admitting: Family Medicine

## 2021-12-31 ENCOUNTER — Other Ambulatory Visit: Payer: Self-pay

## 2021-12-31 VITALS — BP 121/72 | Ht 67.0 in | Wt 156.0 lb

## 2021-12-31 DIAGNOSIS — Z113 Encounter for screening for infections with a predominantly sexual mode of transmission: Secondary | ICD-10-CM

## 2021-12-31 DIAGNOSIS — Z3009 Encounter for other general counseling and advice on contraception: Secondary | ICD-10-CM

## 2021-12-31 LAB — WET PREP FOR TRICH, YEAST, CLUE
Trichomonas Exam: NEGATIVE
Yeast Exam: NEGATIVE

## 2021-12-31 LAB — HEPATITIS B SURFACE ANTIGEN

## 2021-12-31 NOTE — Progress Notes (Signed)
Pt here for PE and Pap.  Pt is not interested in birth control.  Wet mount results reviewed, no treatment required per SO.  Pt declined condoms.  Berdie Ogren, RN
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
## 2022-01-01 NOTE — Progress Notes (Signed)
This patient was seen by C. Kizzie Ide, FNP.  Unable to transfer for provider signature.    Wendi Snipes, FNP

## 2022-01-04 LAB — IGP, APTIMA HPV
HPV Aptima: NEGATIVE
PAP Smear Comment: 0

## 2022-04-17 ENCOUNTER — Emergency Department
Admission: EM | Admit: 2022-04-17 | Discharge: 2022-04-17 | Disposition: A | Discharge status: Home / Self Care | Payer: 59 | Attending: Emergency Medicine | Admitting: Emergency Medicine

## 2022-04-17 ENCOUNTER — Emergency Department: Payer: 59

## 2022-04-17 ENCOUNTER — Other Ambulatory Visit: Payer: Self-pay

## 2022-04-17 DIAGNOSIS — R110 Nausea: Secondary | ICD-10-CM | POA: Insufficient documentation

## 2022-04-17 DIAGNOSIS — R1031 Right lower quadrant pain: Secondary | ICD-10-CM | POA: Insufficient documentation

## 2022-04-17 DIAGNOSIS — R1032 Left lower quadrant pain: Secondary | ICD-10-CM | POA: Insufficient documentation

## 2022-04-17 DIAGNOSIS — R1030 Lower abdominal pain, unspecified: Secondary | ICD-10-CM

## 2022-04-17 LAB — COMPREHENSIVE METABOLIC PANEL
ALT: 17 U/L (ref 0–44)
AST: 22 U/L (ref 15–41)
Albumin: 4.1 g/dL (ref 3.5–5.0)
Alkaline Phosphatase: 47 U/L (ref 38–126)
Anion gap: 6 (ref 5–15)
BUN: 13 mg/dL (ref 6–20)
CO2: 25 mmol/L (ref 22–32)
Calcium: 8.8 mg/dL — ABNORMAL LOW (ref 8.9–10.3)
Chloride: 105 mmol/L (ref 98–111)
Creatinine, Ser: 0.85 mg/dL (ref 0.44–1.00)
GFR, Estimated: >60 mL/min (ref >60)
Glucose, Bld: 95 mg/dL (ref 70–99)
Potassium: 4.4 mmol/L (ref 3.5–5.1)
Sodium: 136 mmol/L (ref 135–145)
Total Bilirubin: 0.3 mg/dL (ref 0.3–1.2)
Total Protein: 7.4 g/dL (ref 6.5–8.1)

## 2022-04-17 LAB — URINALYSIS, ROUTINE W REFLEX MICROSCOPIC
Bilirubin Urine: NEGATIVE
Glucose, UA: NEGATIVE mg/dL
Ketones, ur: NEGATIVE mg/dL
Nitrite: NEGATIVE
Protein, ur: NEGATIVE mg/dL
Specific Gravity, Urine: 1.023 (ref 1.005–1.030)
pH: 5 (ref 5.0–8.0)

## 2022-04-17 LAB — CBC
HCT: 36.5 % (ref 36.0–46.0)
Hemoglobin: 11.8 g/dL — ABNORMAL LOW (ref 12.0–15.0)
MCH: 28.4 pg (ref 26.0–34.0)
MCHC: 32.3 g/dL (ref 30.0–36.0)
MCV: 87.7 fL (ref 80.0–100.0)
Platelets: 437 10*3/uL — ABNORMAL HIGH (ref 150–400)
RBC: 4.16 MIL/uL (ref 3.87–5.11)
RDW: 12.7 % (ref 11.5–15.5)
WBC: 8.5 10*3/uL (ref 4.0–10.5)
nRBC: 0 % (ref 0.0–0.2)

## 2022-04-17 LAB — POC URINE PREG, ED: Preg Test, Ur: NEGATIVE

## 2022-04-17 LAB — LIPASE, BLOOD: Lipase: 33 U/L (ref 11–51)

## 2022-04-17 MED ORDER — IOHEXOL 300 MG/ML  SOLN
80.0000 mL | Freq: Once | INTRAMUSCULAR | Status: AC | PRN
  Administered 2022-04-17: 80 mL via INTRAVENOUS

## 2022-04-17 MED ORDER — LACTATED RINGERS IV BOLUS
1000.0000 mL | Freq: Once | INTRAVENOUS | Status: AC
  Administered 2022-04-17: 1000 mL via INTRAVENOUS

## 2022-04-17 MED ORDER — ONDANSETRON HCL 4 MG/2ML IJ SOLN
4.0000 mg | Freq: Once | INTRAMUSCULAR | Status: AC
  Administered 2022-04-17: 4 mg via INTRAVENOUS
  Filled 2022-04-17: qty 2

## 2022-04-17 NOTE — ED Notes (Signed)
See triage note. Pt brought to room in wheelchair. Pt c/o abdominal pain that started this morning. Pt denies N/V/D.

## 2022-04-17 NOTE — ED Triage Notes (Signed)
Pt c/o lower abd pain with nausea that started a couple hours PTA, states she felt fine when she woke up this morning., denies emesis or diarrhea, states she has been constipated, last BM 5/23

## 2022-04-17 NOTE — ED Provider Notes (Signed)
[facility] Provider Note    Event Date/Time   First MD Initiated Contact with Patient 04/17/22 1047     (approximate)   History   Chief Complaint Abdominal Pain   HPI  Kathleen Hardy is a 36 y.o. female with past medical history of PCOS who presents to the ED complaining of abdominal pain.  Patient reports sudden onset of pain in the bilateral lower quadrants of her abdomen while at work around 730 this morning.  She describes it as a dull aching pain that has been present constantly since then.  It has been associated with nausea but she has not vomited and she denies any changes in her bowel movements.  She does report that she had some burning when she urinated last night, but denies any hematuria, fever, flank pain, vaginal bleeding, or discharge.  Her LMP was on the 19th of this month and she denies any recent missed periods.  She has never had similar symptoms in the past, denies any prior abdominal surgeries.     Physical Exam   Triage Vital Signs: ED Triage Vitals  Enc Vitals Group     BP 04/17/22 1026 116/78     Pulse Rate 04/17/22 1026 68     Resp 04/17/22 1026 18     Temp 04/17/22 1026 98.2 F (36.8 C)     Temp Source 04/17/22 1026 Oral     SpO2 04/17/22 1026 99 %     Weight 04/17/22 1015 160 lb (72.6 kg)     Height 04/17/22 1015 5\' 7"  (1.702 m)     Head Circumference --      Peak Flow --      Pain Score 04/17/22 1015 6     Pain Loc --      Pain Edu? --      Excl. in [place]? --     Most recent vital signs: Vitals:   04/17/22 1026  BP: 116/78  Pulse: 68  Resp: 18  Temp: 98.2 F (36.8 C)  SpO2: 99%    Constitutional: Alert and oriented. Eyes: Conjunctivae are normal. Head: Atraumatic. Nose: No congestion/rhinnorhea. Mouth/Throat: Mucous membranes are moist.  Cardiovascular: Normal rate, regular rhythm. Grossly normal heart sounds.  2+ radial pulses bilaterally. Respiratory: Normal respiratory effort.  No retractions. Lungs  CTAB. Gastrointestinal: Soft and tender to palpation in the right lower quadrant with no rebound or guarding.  No CVA tenderness bilaterally. No distention. Musculoskeletal: No lower extremity tenderness nor edema.  Neurologic:  Normal speech and language. No gross focal neurologic deficits are appreciated.    ED Results / Procedures / Treatments   Labs (all labs ordered are listed, but only abnormal results are displayed) Labs Reviewed  COMPREHENSIVE METABOLIC PANEL - Abnormal; Notable for the following components:      Result Value   Calcium 8.8 (*)    All other components within normal limits  CBC - Abnormal; Notable for the following components:   Hemoglobin 11.8 (*)    Platelets 437 (*)    All other components within normal limits  URINALYSIS, ROUTINE W REFLEX MICROSCOPIC - Abnormal; Notable for the following components:   Color, Urine YELLOW (*)    APPearance CLOUDY (*)    Hgb urine dipstick SMALL (*)    Leukocytes,Ua LARGE (*)    Bacteria, UA RARE (*)    All other components within normal limits  LIPASE, BLOOD  POC URINE PREG, ED    RADIOLOGY CT of abdomen/pelvis reviewed and  interpreted by me with no inflammatory changes, dilated bowel loops, or focal fluid collections.  PROCEDURES:  Critical Care performed: No  Procedures   MEDICATIONS ORDERED IN ED: Medications  lactated ringers bolus 1,000 mL (1,000 mLs Intravenous New Bag/Given 04/17/22 1140)  ondansetron (ZOFRAN) injection 4 mg (4 mg Intravenous Given 04/17/22 1137)  iohexol (OMNIPAQUE) 300 MG/ML solution 80 mL (80 mLs Intravenous Contrast Given 04/17/22 1158)     IMPRESSION / MDM / ASSESSMENT AND PLAN / ED COURSE  I reviewed the triage vital signs and the nursing notes.                              36 y.o. female with past medical history of PCOS who presents to the ED complaining of bilateral lower quadrant abdominal pain since 730 this morning associated with nausea.  Differential diagnosis  includes, but is not limited to, appendicitis, colitis, UTI, kidney stone, ovarian cyst, ovarian torsion.  Patient well-appearing and in no acute distress, vital signs are unremarkable.  She does have focal tenderness to palpation in the right lower quadrant of her abdomen and we will further assess with CT scan for appendicitis or other explanation for her pain.  Labs thus far are reassuring, CBC shows no anemia or leukocytosis, BMP without electrolyte abnormality or AKI, LFTs and lipase within normal limits.  Pregnancy testing and UA are pending, plan to treat symptomatically with IV Zofran and fluid bolus, patient declines pain medication.   CT scan is unremarkable, no evidence of appendicitis or other acute process to explain patient's pain.  Urinalysis does not appear consistent with infection and no cysts noted on ovaries to raise suspicion for torsion.  Patient reports pain seems to be resolving at this time and she is appropriate for discharge home with PCP follow-up.  She was counseled to return to the ED for new worsening symptoms, patient agrees with plan.      FINAL CLINICAL IMPRESSION(S) / ED DIAGNOSES   Final diagnoses:  Lower abdominal pain     Rx / DC Orders   ED Discharge Orders     None        Note:  This document was prepared using Dragon voice recognition software and may include unintentional dictation errors.   Blake Divine, MD 04/17/22 1249

## 2022-04-18 ENCOUNTER — Encounter: Payer: Self-pay | Admitting: Family Medicine

## 2022-04-18 ENCOUNTER — Ambulatory Visit: Payer: Self-pay | Admitting: Family Medicine

## 2022-04-18 DIAGNOSIS — A599 Trichomoniasis, unspecified: Secondary | ICD-10-CM

## 2022-04-18 DIAGNOSIS — Z113 Encounter for screening for infections with a predominantly sexual mode of transmission: Secondary | ICD-10-CM

## 2022-04-18 LAB — HM HIV SCREENING LAB: HM HIV Screening: NEGATIVE

## 2022-04-18 LAB — WET PREP FOR TRICH, YEAST, CLUE
Trichomonas Exam: POSITIVE — AB
Yeast Exam: NEGATIVE

## 2022-04-18 MED ORDER — METRONIDAZOLE 500 MG PO TABS: 500.0000 mg | ORAL_TABLET | Freq: Two times a day (BID) | ORAL | 0 refills | Status: AC

## 2022-04-18 NOTE — Progress Notes (Signed)
Pt here for STD screening.  Wet mount results reviewed.  The patient was dispensed Metronidazole 500 mg today. I provided counseling today regarding the medication. We discussed the medication, the side effects and when to call clinic. Patient given the opportunity to ask questions. Questions answered. Condoms declined.  Jamiee Milholland M Cato Liburd, RN   
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
## 2022-05-12 NOTE — Progress Notes (Unsigned)
Late Entry Pap triage NIL, HPV neg Next pap in 5 years

## 2022-05-12 NOTE — Progress Notes (Unsigned)
PAP Normal and HPV Negative.  Repeat PAP in 5 years (12-2026) per Kimberly Newton, MD.  PAP card mailed today.  Routed 05-12-2022.  Patrcia Schnepp, RN  

## 2022-07-04 ENCOUNTER — Emergency Department: Payer: 59

## 2022-07-04 ENCOUNTER — Other Ambulatory Visit: Payer: Self-pay

## 2022-07-04 ENCOUNTER — Encounter: Payer: Self-pay | Admitting: Intensive Care

## 2022-07-04 ENCOUNTER — Emergency Department
Admission: EM | Admit: 2022-07-04 | Discharge: 2022-07-04 | Disposition: A | Discharge status: Home / Self Care | Payer: 59 | Attending: Emergency Medicine | Admitting: Emergency Medicine

## 2022-07-04 DIAGNOSIS — M79672 Pain in left foot: Secondary | ICD-10-CM | POA: Insufficient documentation

## 2022-07-04 DIAGNOSIS — W208XXA Other cause of strike by thrown, projected or falling object, initial encounter: Secondary | ICD-10-CM | POA: Diagnosis not present

## 2022-07-04 MED ORDER — CYCLOBENZAPRINE HCL 10 MG PO TABS: 10.0000 mg | ORAL_TABLET | Freq: Three times a day (TID) | ORAL | 0 refills | Status: AC | PRN

## 2022-07-04 MED ORDER — NAPROXEN 500 MG PO TABS: 500.0000 mg | ORAL_TABLET | Freq: Two times a day (BID) | ORAL | 0 refills | Status: AC

## 2022-07-04 NOTE — Discharge Instructions (Addendum)
-  You may take Tylenol and naproxen as needed for pain.  If experiencing additional pain or muscle spasms in the foot, you may take cyclobenzaprine.  Use caution though as it may make you drowsy/dizzy.  -Avoid any running, sprinting, or jumping for the next few weeks.  -Return to the emergency department anytime if you begin to experience any new or worsening symptoms.

## 2022-07-04 NOTE — ED Triage Notes (Signed)
Patient reports dropping metal rod on left foot this AM

## 2022-07-04 NOTE — ED Provider Notes (Signed)
[facility] Provider Note    Event Date/Time   First MD Initiated Contact with Patient 07/04/22 1151     (approximate)   History   Chief Complaint Foot Pain   HPI Kathleen Hardy is a 36 y.o. female, history of depression, marijuana use, PCOS, presents emergency department for evaluation of left foot pain.  She states that she dropped a large metal pole on top of her earlier this morning.  Currently has most of her pain around her big toe.  She is still able to ambulate.  Denies any other injuries.  Denies ankle pain, lower leg pain, knee pain, thigh pain, or hip pain.  History Limitations: No limitations.        Physical Exam  Triage Vital Signs: ED Triage Vitals  Enc Vitals Group     BP 07/04/22 1033 125/82     Pulse Rate 07/04/22 1033 92     Resp 07/04/22 1033 16     Temp 07/04/22 1033 97.6 F (36.4 C)     Temp Source 07/04/22 1033 Oral     SpO2 07/04/22 1033 99 %     Weight 07/04/22 1028 160 lb (72.6 kg)     Height 07/04/22 1028 5\' 7"  (1.702 m)     Head Circumference --      Peak Flow --      Pain Score 07/04/22 1028 7     Pain Loc --      Pain Edu? --      Excl. in [place]? --     Most recent vital signs: Vitals:   07/04/22 1033  BP: 125/82  Pulse: 92  Resp: 16  Temp: 97.6 F (36.4 C)  SpO2: 99%    General: Awake, NAD.  Skin: Warm, dry. No rashes or lesions.  Eyes: PERRL. Conjunctivae normal.  CV: Good peripheral perfusion.  Resp: Normal effort.  Abd: Soft, non-tender. No distention.  Neuro: At baseline. No gross neurological deficits.   Focused Exam: No gross deformities to the left foot.  Mild bony tenderness along the big toe, no deformities or tenderness along the metatarsals or ankle joint.  Normal flexion and extension with all digits.  She is still able to ambulate on her own.  Normal cap refill in all digits.  Normal sensation.  Physical Exam    ED Results / Procedures / Treatments  Labs (all labs ordered are  listed, but only abnormal results are displayed) Labs Reviewed - No data to display   EKG N/A.   RADIOLOGY  ED Provider Interpretation: I personally viewed and interpreted this x-ray, no evidence of acute fractures or dislocations.  DG Foot Complete Left  Result Date: 07/04/2022 CLINICAL DATA:  Injury, swelling EXAM: LEFT FOOT - COMPLETE 3+ VIEW COMPARISON:  None Available. FINDINGS: There is no evidence of fracture or dislocation. There is no evidence of arthropathy or other focal bone abnormality. Soft tissues are unremarkable. IMPRESSION: Negative. Electronically Signed   By: 09/03/2022.  Shick M.D.   On: 07/04/2022 10:58    PROCEDURES:  Critical Care performed: N/A.  Procedures    MEDICATIONS ORDERED IN ED: Medications - No data to display   IMPRESSION / MDM / ASSESSMENT AND PLAN / ED COURSE  I reviewed the triage vital signs and the nursing notes.                              Differential diagnosis includes, but is not  limited to, phalangeal fracture, metatarsal fracture, ankle sprain, toe sprain  Assessment/Plan Patient presents with left foot injury after a metal pole dropped on her foot.  X-ray reassuring for no evidence of fractures or dislocations.  Physical exam is unremarkable, no evidence of any flexor or extensor tendon injuries.  Low suspicion for any occult injuries warranting advanced imaging.  We will provide her with prescription for naproxen and cyclobenzaprine for symptom management.  Will discharge.  Provided the patient with anticipatory guidance, return precautions, and educational material. Encouraged the patient to return to the emergency department at any time if they begin to experience any new or worsening symptoms. Patient expressed understanding and agreed with the plan.   Patient's presentation is most consistent with acute complicated illness / injury requiring diagnostic workup.       FINAL CLINICAL IMPRESSION(S) / ED DIAGNOSES   Final  diagnoses:  Foot pain, left     Rx / DC Orders   ED Discharge Orders          Ordered    cyclobenzaprine (FLEXERIL) 10 MG tablet  3 times daily PRN        07/04/22 1431    naproxen (NAPROSYN) 500 MG tablet  2 times daily with meals        07/04/22 1431             Note:  This document was prepared using Dragon voice recognition software and may include unintentional dictation errors.   Varney Daily, [place] 07/04/22 1432    Arnaldo Natal, MD 07/04/22 [phone]

## 2022-11-26 ENCOUNTER — Encounter: Payer: Self-pay | Admitting: *Deleted

## 2022-11-26 ENCOUNTER — Emergency Department
Admission: EM | Admit: 2022-11-26 | Discharge: 2022-11-27 | Disposition: A | Discharge status: Home / Self Care | Payer: BLUE CROSS/BLUE SHIELD | Attending: Emergency Medicine | Admitting: Emergency Medicine

## 2022-11-26 ENCOUNTER — Other Ambulatory Visit: Payer: Self-pay

## 2022-11-26 DIAGNOSIS — Z1152 Encounter for screening for COVID-19: Secondary | ICD-10-CM | POA: Insufficient documentation

## 2022-11-26 DIAGNOSIS — R059 Cough, unspecified: Secondary | ICD-10-CM | POA: Diagnosis present

## 2022-11-26 DIAGNOSIS — J069 Acute upper respiratory infection, unspecified: Secondary | ICD-10-CM | POA: Diagnosis not present

## 2022-11-26 LAB — RESP PANEL BY RT-PCR (RSV, FLU A&B, COVID)  RVPGX2
Influenza A by PCR: NEGATIVE
Influenza B by PCR: NEGATIVE
Resp Syncytial Virus by PCR: NEGATIVE
SARS Coronavirus 2 by RT PCR: NEGATIVE

## 2022-11-26 MED ORDER — IPRATROPIUM-ALBUTEROL 0.5-2.5 (3) MG/3ML IN SOLN
3.0000 mL | Freq: Once | RESPIRATORY_TRACT | Status: AC
  Administered 2022-11-26: 3 mL via RESPIRATORY_TRACT
  Filled 2022-11-26: qty 3

## 2022-11-26 MED ORDER — PREDNISONE 20 MG PO TABS
40.0000 mg | ORAL_TABLET | Freq: Once | ORAL | Status: AC
  Administered 2022-11-26: 40 mg via ORAL
  Filled 2022-11-26: qty 2

## 2022-11-26 MED ORDER — PREDNISONE 50 MG PO TABS: 50.0000 mg | ORAL_TABLET | Freq: Every day | ORAL | 0 refills | Status: DC

## 2022-11-26 NOTE — ED Provider Notes (Signed)
   [facility] Provider Note    Event Date/Time   First MD Initiated Contact with Patient 11/26/22 2203     (approximate)   History   Cough   HPI  Kathleen Hardy is a 37 y.o. female who presents with complaints of cough, times about 24 hours, she reports she feels some tightness in her chest and some mild shortness of breath.  No calf pain or swelling.  No fevers   Physical Exam   Triage Vital Signs: ED Triage Vitals  Enc Vitals Group     BP 11/26/22 1936 120/75     Pulse Rate 11/26/22 1936 (!) 110     Resp 11/26/22 1936 20     Temp 11/26/22 1936 98.8 F (37.1 C)     Temp Source 11/26/22 1936 Oral     SpO2 11/26/22 1936 100 %     Weight 11/26/22 1937 72 kg (158 lb 11.7 oz)     Height 11/26/22 1937 1.702 m (5\' 7" )     Head Circumference --      Peak Flow --      Pain Score 11/26/22 1937 0     Pain Loc --      Pain Edu? --      Excl. in [place]? --     Most recent vital signs: Vitals:   11/26/22 1936 11/26/22 2205  BP: 120/75 134/89  Pulse: (!) 110 (!) 111  Resp: 20 20  Temp: 98.8 F (37.1 C) 99 F (37.2 C)  SpO2: 100% 98%     General: Awake, no distress.  CV:  Good peripheral perfusion.  Resp:  Normal effort.  Scattered mild wheezes Abd:  No distention.  Other:     ED Results / Procedures / Treatments   Labs (all labs ordered are listed, but only abnormal results are displayed) Labs Reviewed  RESP PANEL BY RT-PCR (RSV, FLU A&B, COVID)  [id number]     EKG     RADIOLOGY     PROCEDURES:  Critical Care performed:   Procedures   MEDICATIONS ORDERED IN ED: Medications  ipratropium-albuterol (DUONEB) 0.5-2.5 (3) MG/3ML nebulizer solution 3 mL (3 mLs Nebulization Given 11/26/22 2218)  predniSONE (DELTASONE) tablet 40 mg (40 mg Oral Given 11/26/22 2218)     IMPRESSION / MDM / [place] / ED COURSE  I reviewed the triage vital signs and the nursing notes. Patient's presentation is most consistent with acute  presentation with potential threat to life or bodily function.  Patient presents with cough, mild shortness of breath as noted above.  Differential includes pneumonia, upper respiratory infection, viral, bronchitis with bronchospasm  Lung exam is overall reassuring, scattered wheezes noted, most consistent with bronchospasm, treated with DuoNeb and prednisone with significant improvement in symptoms.  Appropriate for discharge with outpatient follow-up, prednisone prescription provided.        FINAL CLINICAL IMPRESSION(S) / ED DIAGNOSES   Final diagnoses:  Viral URI with cough     Rx / DC Orders   ED Discharge Orders          Ordered    predniSONE (DELTASONE) 50 MG tablet  Daily with breakfast        11/26/22 2327             Note:  This document was prepared using Dragon voice recognition software and may include unintentional dictation errors.   Lavonia Drafts, MD 11/26/22 2330

## 2022-11-26 NOTE — ED Provider Triage Note (Signed)
Emergency Medicine Provider Triage Evaluation Note  Kathleen Hardy, a 37 y.o. female  was evaluated in triage.  Pt complains of cough and cough and was vomiting she also reports sinus congestion and generalized malaise.  Endorses sick contacts in her home.  Review of Systems  Positive: Cough, congestion Negative: FCS  Physical Exam  BP 120/75 (BP Location: Left Arm)   Pulse (!) 110   Temp 98.8 F (37.1 C) (Oral)   Resp 20   SpO2 100%  Gen:   Awake, no distress  NAD Resp:  Normal effort  MSK:   Moves extremities without difficulty  Other:    Medical Decision Making  Medically screening exam initiated at 7:37 PM.  Appropriate orders placed.  Kathleen Hardy was informed that the remainder of the evaluation will be completed by another provider, this initial triage assessment does not replace that evaluation, and the importance of remaining in the ED until their evaluation is complete.  Patient to the ED for evaluation of cough, congestion, and cough induced vomiting.   Kathleen Needles, Kathleen Hardy 11/26/22 [phone]

## 2022-11-26 NOTE — ED Triage Notes (Signed)
Pt has bodyaches, chills, cough, vomiting for 2 days.  Pt reports feeling weak.  Pt alert  speech clear.

## 2023-02-04 IMAGING — US US BREAST*R* LIMITED INC AXILLA
1 series · 2 of 2 positions shown · non-contrast
Comparison: Previous exam(s).

CLINICAL DATA: RIGHT subclavicular palpable area.

EXAM:
DIGITAL DIAGNOSTIC BILATERAL MAMMOGRAM WITH TOMOSYNTHESIS AND CAD;
ULTRASOUND RIGHT BREAST LIMITED
TECHNIQUE: Bilateral digital diagnostic mammography and breast tomosynthesis
was performed. The images were evaluated with computer-aided
detection.; Targeted ultrasound examination of the right breast was
performed

[Series 1: us breast*right* limited inc axilla · 0.06mm/px · 2 of 2 slices shown]
[im 1/2]
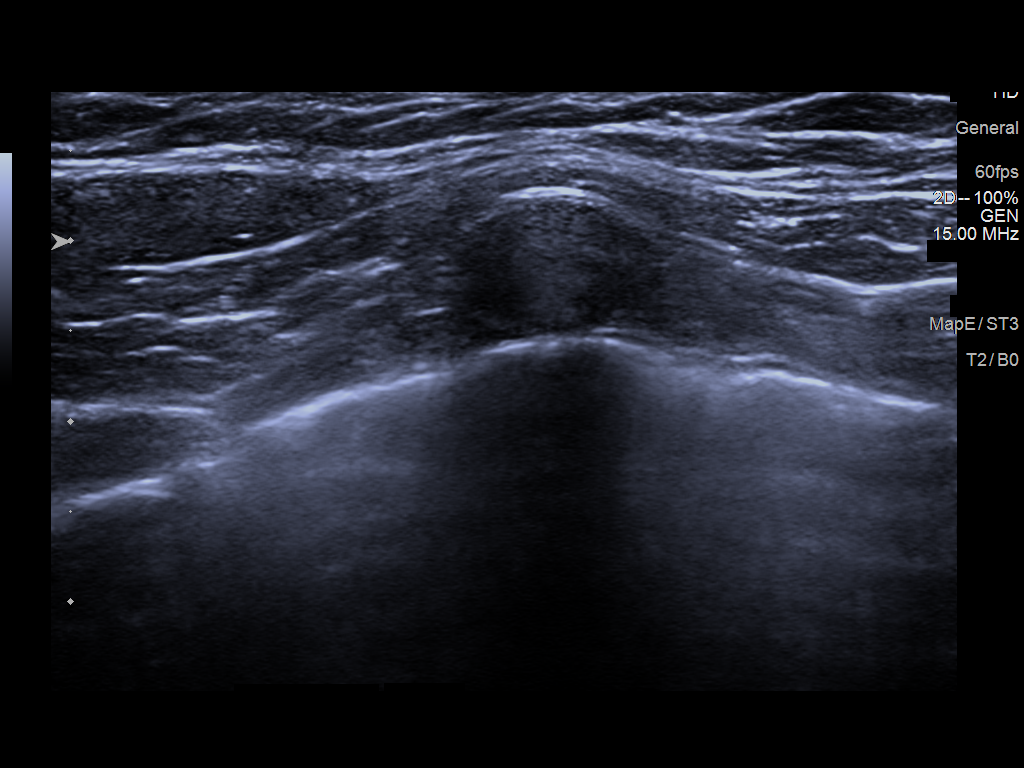
[im 2/2]
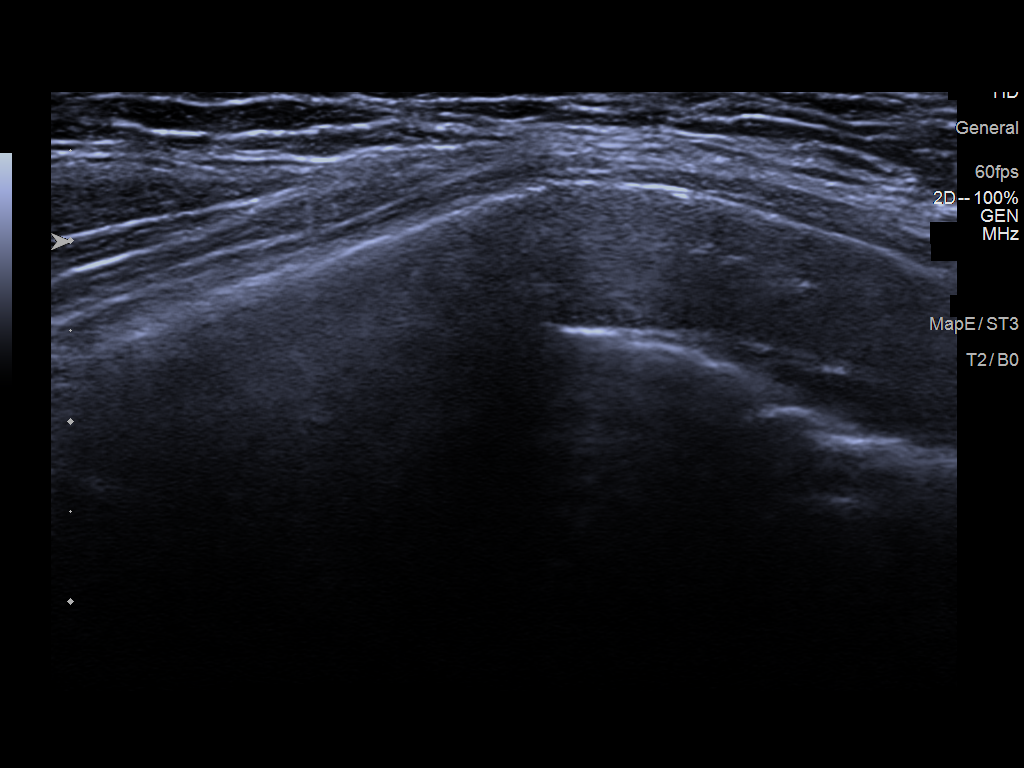

[2 of 2 positions shown; findings below may reference images not displayed]

ACR Breast Density Category c: The breast tissue is heterogeneously
dense, which may obscure small masses.
FINDINGS: Spot compression tomosynthesis views were obtained over the
palpable/painful area of concern in the RIGHT breast/subclavicular
area. No suspicious mammographic finding is identified in this area.
No suspicious mass, microcalcification, or other finding is
identified in either breast.

On physical exam, no suspicious mass is appreciated. A firm rib is
noted at the site of palpable/painful concern.

Targeted RIGHT breast ultrasound was performed in the palpable area
of concern at the RIGHT subclavicular area. No suspicious solid or
cystic mass is identified. A prominent rib is noted sonographically
at the site of palpable/painful concern.
IMPRESSION: 1. No mammographic or sonographic evidence of malignancy at the site
of palpable/painful concern in the RIGHT upper breast/subclavicular
area. There is a prominent rib noted at this site. Any further
workup of the patient's symptoms should be based on the clinical
assessment. Recommend routine annual screening mammogram at the age
of 40.
2. No mammographic evidence of malignancy bilaterally.

RECOMMENDATION:
Screening mammogram at age 40 unless there are persistent or
intervening clinical concerns. (Code:KB-0-A4T)

I have discussed the findings and recommendations with the patient.
If applicable, a reminder letter will be sent to the patient
regarding the next appointment.

BI-RADS CATEGORY  2: Benign.

## 2023-03-02 ENCOUNTER — Ambulatory Visit: Payer: BLUE CROSS/BLUE SHIELD

## 2023-07-23 ENCOUNTER — Encounter: Payer: Self-pay | Admitting: Family Medicine

## 2023-07-23 ENCOUNTER — Ambulatory Visit (LOCAL_COMMUNITY_HEALTH_CENTER): Payer: Self-pay | Admitting: Family Medicine

## 2023-07-23 VITALS — BP 109/71 | HR 87 | Ht 67.0 in | Wt 171.4 lb

## 2023-07-23 DIAGNOSIS — Z01419 Encounter for gynecological examination (general) (routine) without abnormal findings: Secondary | ICD-10-CM | POA: Diagnosis not present

## 2023-07-23 DIAGNOSIS — Z3009 Encounter for other general counseling and advice on contraception: Secondary | ICD-10-CM | POA: Diagnosis not present

## 2023-07-23 DIAGNOSIS — Z308 Encounter for other contraceptive management: Secondary | ICD-10-CM | POA: Diagnosis not present

## 2023-07-23 DIAGNOSIS — Z113 Encounter for screening for infections with a predominantly sexual mode of transmission: Secondary | ICD-10-CM

## 2023-07-23 LAB — HM HIV SCREENING LAB: HM HIV Screening: NEGATIVE

## 2023-07-23 LAB — WET PREP FOR TRICH, YEAST, CLUE
Trichomonas Exam: NEGATIVE
Yeast Exam: NEGATIVE
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
## 2023-07-23 NOTE — Progress Notes (Signed)
 Pt is here for family planning visit.  Family planning packet reviewed and given to pt.  Wet prep results reviewed, no treatment required per standing orders. Condoms declined. Gaspar Garbe, RN

## 2023-08-04 LAB — HGB A1C W/O EAG: Hgb A1c MFr Bld: 5.4 % (ref 4.8–5.6)

## 2023-08-05 ENCOUNTER — Ambulatory Visit: Payer: BLUE CROSS/BLUE SHIELD

## 2023-08-05 ENCOUNTER — Telehealth: Payer: Self-pay

## 2023-08-05 DIAGNOSIS — A749 Chlamydial infection, unspecified: Secondary | ICD-10-CM

## 2023-08-05 MED ORDER — AZITHROMYCIN 500 MG PO TABS
1000.0000 mg | ORAL_TABLET | Freq: Once | ORAL | Status: AC
  Administered 2023-08-05: 1000 mg via ORAL

## 2023-08-05 NOTE — Telephone Encounter (Signed)
Password verified.  Pt notified of positive STD result.  Appointment made for today @ 1:30.  Berdie Ogren, RN

## 2023-08-05 NOTE — Progress Notes (Signed)
Pt is here for treatment of Chlamydia. LMP 07/28/2023, however pt has had unprotected sex recently. The patient was dispensed Azithromycin 500 mg #2 today. I provided counseling today regarding the medication. We discussed the medication, the side effects and when to call clinic. Patient given the opportunity to ask questions. Questions answered.   Chlamydia pamphlet, Azithromycin information sheet and condoms given.  Pt refused contact card, stating that she informed her partners by phone.  Berdie Ogren, RN

## 2023-08-10 ENCOUNTER — Telehealth: Payer: Self-pay | Admitting: Family Medicine

## 2023-08-10 NOTE — Telephone Encounter (Signed)
Pt called back and stated that her partner had  tested positive for Chlamydia.   Appointment scheduled for 9/18 @ 8:20.  Berdie Ogren, RN

## 2023-08-10 NOTE — Telephone Encounter (Signed)
LM for pt to return my call

## 2023-08-10 NOTE — Telephone Encounter (Signed)
Patient called stating she got a call about contact to STD. Her test came back neg but she still has questions about testing and treatment. Can someone call her? Please

## 2023-08-12 ENCOUNTER — Ambulatory Visit: Payer: BLUE CROSS/BLUE SHIELD | Admitting: Family Medicine

## 2023-08-12 ENCOUNTER — Encounter: Payer: Self-pay | Admitting: Family Medicine

## 2023-08-12 DIAGNOSIS — Z202 Contact with and (suspected) exposure to infections with a predominantly sexual mode of transmission: Secondary | ICD-10-CM

## 2023-08-12 MED ORDER — PENICILLIN G BENZATHINE 1200000 UNIT/2ML IM SUSY
2.4000 10*6.[IU] | PREFILLED_SYRINGE | Freq: Once | INTRAMUSCULAR | Status: AC
  Administered 2023-08-12: 2.4 10*6.[IU] via INTRAMUSCULAR
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
## 2023-08-13 ENCOUNTER — Emergency Department
Admission: EM | Admit: 2023-08-13 | Discharge: 2023-08-13 | Disposition: A | Discharge status: Home / Self Care | Payer: BLUE CROSS/BLUE SHIELD | Attending: Emergency Medicine | Admitting: Emergency Medicine

## 2023-08-13 ENCOUNTER — Emergency Department: Payer: BLUE CROSS/BLUE SHIELD

## 2023-08-13 ENCOUNTER — Other Ambulatory Visit: Payer: Self-pay

## 2023-08-13 ENCOUNTER — Encounter: Payer: Self-pay | Admitting: Emergency Medicine

## 2023-08-13 DIAGNOSIS — G43809 Other migraine, not intractable, without status migrainosus: Secondary | ICD-10-CM | POA: Diagnosis not present

## 2023-08-13 DIAGNOSIS — R519 Headache, unspecified: Secondary | ICD-10-CM | POA: Diagnosis present

## 2023-08-13 MED ORDER — SODIUM CHLORIDE 0.9 % IV BOLUS
1000.0000 mL | Freq: Once | INTRAVENOUS | Status: AC
  Administered 2023-08-13: 1000 mL via INTRAVENOUS

## 2023-08-13 MED ORDER — METOCLOPRAMIDE HCL 5 MG/ML IJ SOLN
10.0000 mg | Freq: Once | INTRAMUSCULAR | Status: AC
  Administered 2023-08-13: 10 mg via INTRAVENOUS
  Filled 2023-08-13: qty 2

## 2023-08-13 MED ORDER — KETOROLAC TROMETHAMINE 15 MG/ML IJ SOLN
15.0000 mg | Freq: Once | INTRAMUSCULAR | Status: AC
  Administered 2023-08-13: 15 mg via INTRAVENOUS
  Filled 2023-08-13: qty 1

## 2023-08-13 NOTE — ED Notes (Signed)
See triage notes. Patient c/o nausea and a headache since 0600. Sensitive to noise and light.

## 2023-08-13 NOTE — Discharge Instructions (Signed)
He did not wish to wait for the results of your CAT scan.  You may find the results on MyChart, and I will look out for your results as well.  Please return for any new, worsening, or change in symptoms or other concerns.  Please follow-up with neurology.  It was a pleasure caring for you today.

## 2023-08-13 NOTE — ED Triage Notes (Signed)
C/O headache and nausea since 0600.  C/O headache to forehead area.  Sensitive to noise and light.
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
## 2023-09-01 ENCOUNTER — Other Ambulatory Visit: Payer: Self-pay

## 2023-09-01 ENCOUNTER — Emergency Department
Admission: EM | Admit: 2023-09-01 | Discharge: 2023-09-01 | Disposition: A | Discharge status: Home / Self Care | Payer: BLUE CROSS/BLUE SHIELD | Attending: Emergency Medicine | Admitting: Emergency Medicine

## 2023-09-01 ENCOUNTER — Encounter: Payer: Self-pay | Admitting: Emergency Medicine

## 2023-09-01 DIAGNOSIS — R519 Headache, unspecified: Secondary | ICD-10-CM | POA: Diagnosis present

## 2023-09-01 DIAGNOSIS — R112 Nausea with vomiting, unspecified: Secondary | ICD-10-CM | POA: Diagnosis not present

## 2023-09-01 DIAGNOSIS — H538 Other visual disturbances: Secondary | ICD-10-CM | POA: Diagnosis not present

## 2023-09-01 LAB — POC URINE PREG, ED: Preg Test, Ur: NEGATIVE

## 2023-09-01 MED ORDER — DIPHENHYDRAMINE HCL 25 MG PO CAPS
25.0000 mg | ORAL_CAPSULE | Freq: Once | ORAL | Status: AC
  Administered 2023-09-01: 25 mg via ORAL
  Filled 2023-09-01: qty 1

## 2023-09-01 MED ORDER — PROCHLORPERAZINE EDISYLATE 10 MG/2ML IJ SOLN
10.0000 mg | Freq: Once | INTRAMUSCULAR | Status: AC
  Administered 2023-09-01: 10 mg via INTRAVENOUS
  Filled 2023-09-01: qty 2

## 2023-09-01 MED ORDER — PROCHLORPERAZINE MALEATE 10 MG PO TABS: 10.0000 mg | ORAL_TABLET | Freq: Three times a day (TID) | ORAL | 0 refills | Status: DC | PRN

## 2023-09-01 MED ORDER — SODIUM CHLORIDE 0.9 % IV BOLUS
1000.0000 mL | Freq: Once | INTRAVENOUS | Status: AC
  Administered 2023-09-01: 1000 mL via INTRAVENOUS

## 2023-09-01 NOTE — ED Provider Notes (Signed)
[facility] Provider Note    Event Date/Time   First MD Initiated Contact with Patient 09/01/23 [phone]     (approximate)   History   Headache   HPI  Kathleen Hardy is a 37 y.o. female who presents to the emergency department today because of concerns for right sided headache.  She states that she started having a dull headache last night before going to bed.  She took some Tylenol.  However early this morning the pain was more severe.  Located primarily behind her right eye.  Has been accompanied by nausea and vomiting that she was unable to take any further pain medication.  She has had some associated blurry vision.  She states that she started getting headaches like this roughly a month ago.  Was seen in the emergency department once for this type of headache.  Denies any trauma to her head.     Physical Exam   Triage Vital Signs: ED Triage Vitals  Encounter Vitals Group     BP 09/01/23 0542 (!) 128/96     Systolic BP Percentile --      Diastolic BP Percentile --      Pulse Rate 09/01/23 0542 72     Resp 09/01/23 0542 20     Temp 09/01/23 0542 98.3 F (36.8 C)     Temp src --      SpO2 09/01/23 0542 100 %     Weight 09/01/23 0541 171 lb (77.6 kg)     Height 09/01/23 0541 5\' 6"  (1.676 m)     Head Circumference --      Peak Flow --      Pain Score 09/01/23 0541 9     Pain Loc --      Pain Education --      Exclude from Growth Chart --     Most recent vital signs: Vitals:   09/01/23 0542  BP: (!) 128/96  Pulse: 72  Resp: 20  Temp: 98.3 F (36.8 C)  SpO2: 100%   General: Awake, alert, oriented. CV:  Good peripheral perfusion. Regular rate and rhythm. Resp:  Normal effort. Lungs clear. Abd:  No distention.     ED Results / Procedures / Treatments   Labs (all labs ordered are listed, but only abnormal results are displayed) Labs Reviewed  POC URINE PREG, ED     EKG  None   RADIOLOGY None  PROCEDURES:  Critical Care  performed: No   MEDICATIONS ORDERED IN ED: Medications - No data to display   IMPRESSION / MDM / ASSESSMENT AND PLAN / ED COURSE  I reviewed the triage vital signs and the nursing notes.                              Differential diagnosis includes, but is not limited to, tension headache, migraine, glaucoma  Patient's presentation is most consistent with acute presentation with potential threat to life or bodily function.   Patient presented to the emergency department today because of concerns for right sided headache.  Per chart review she was seen for similar complaint a few weeks ago.  Had CT head done at that time which was negative.  She denies any trauma.  At this time do not feel emergent repeat neuroimaging is necessary.  Will give medication and attempt symptomatic control.   Patient felt significant improvement after medication. Will plan on discharging with prescription for compazine.  Will again give neurology follow up information.  FINAL CLINICAL IMPRESSION(S) / ED DIAGNOSES   Final diagnoses:  Bad headache     Note:  This document was prepared using Dragon voice recognition software and may include unintentional dictation errors.    Phineas Semen, MD 09/01/23 1034

## 2023-09-01 NOTE — ED Triage Notes (Signed)
Patient ambulatory to triage with steady gait, without difficulty or distress noted; pt reports rt sided HA accomp by N/V this morning; st seen for same recently and had CT scan with no abnormal findings

## 2023-09-03 IMAGING — CT CT ABD-PELV W/ CM
2 of 4 series · 16 of 46 positions shown, 18 images · IV contrast (APPLIED)
Comparison: None Available.

CLINICAL DATA: Right lower quadrant pain with nausea

EXAM:
CT ABDOMEN AND PELVIS WITH CONTRAST
TECHNIQUE: Multidetector CT imaging of the abdomen and pelvis was performed
using the standard protocol following bolus administration of
intravenous contrast.

[Series 2: routine abd/pel with · axial · 0.77mm/px · z∈[-1142,-686]mm · 13 of 101 slices shown, 15 images]
[im 5/101  soft-tissue]
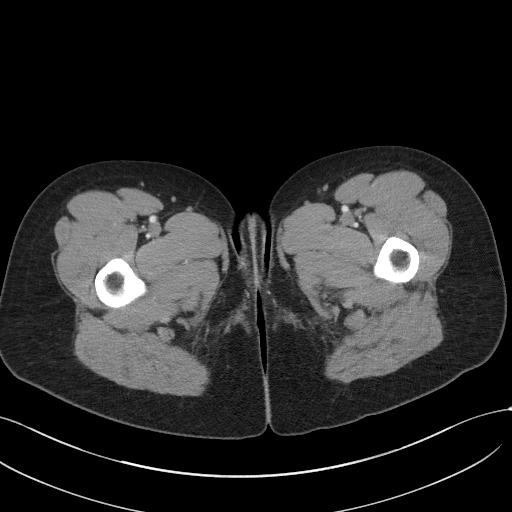
[im 5/101  bone]
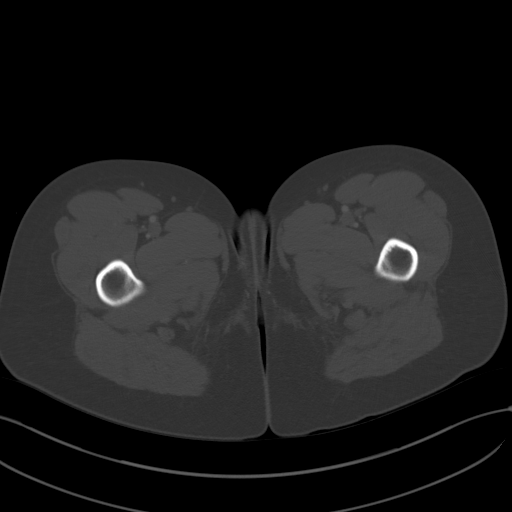
[im 13/101  soft-tissue]
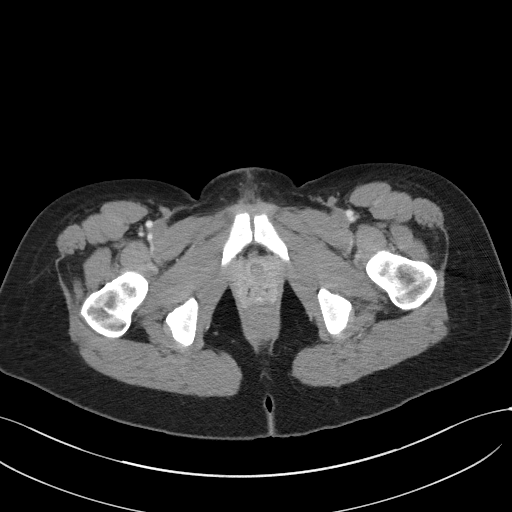
[im 21/101  soft-tissue]
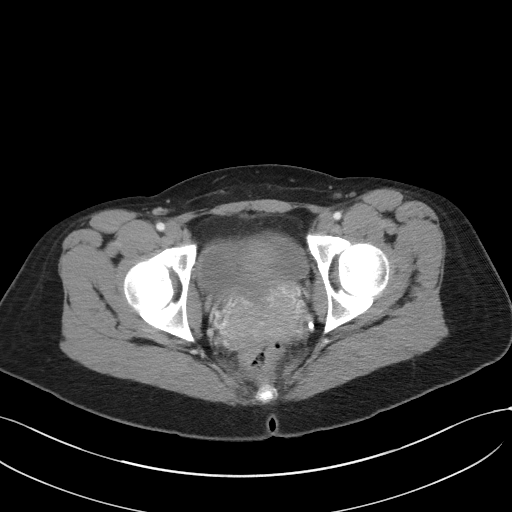
[im 30/101  soft-tissue]
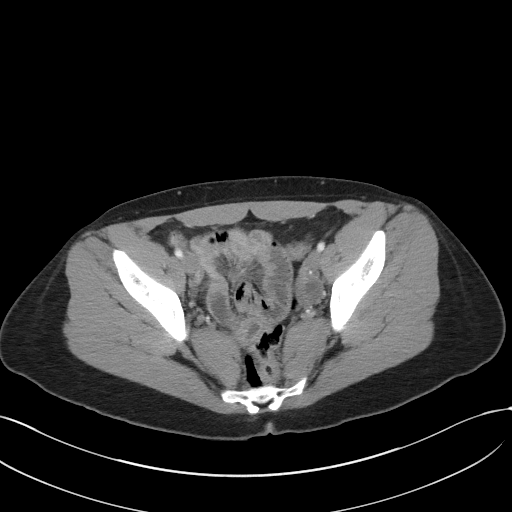
[im 34/101  soft-tissue]
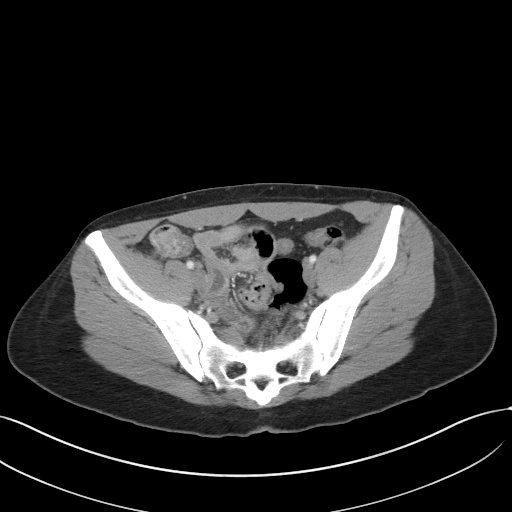
[im 42/101  soft-tissue]
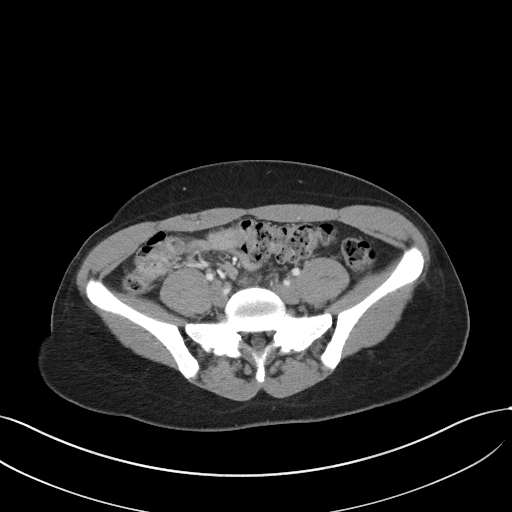
[im 51/101  soft-tissue]
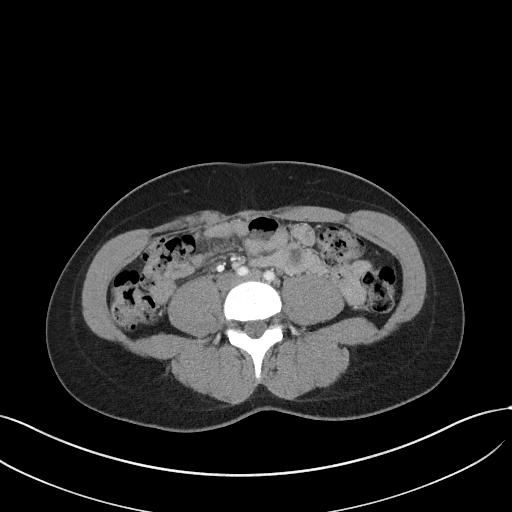
[im 59/101  soft-tissue]
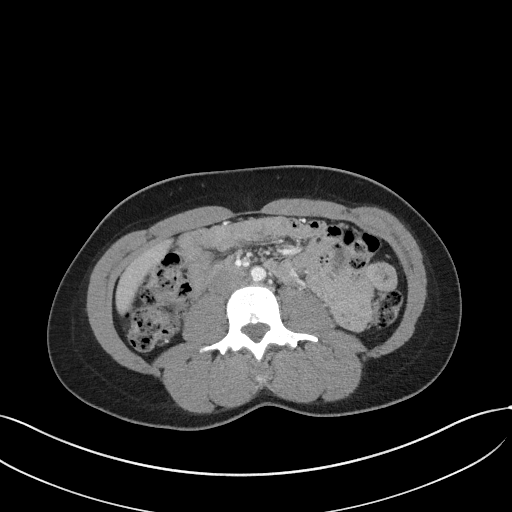
[im 67/101  soft-tissue]
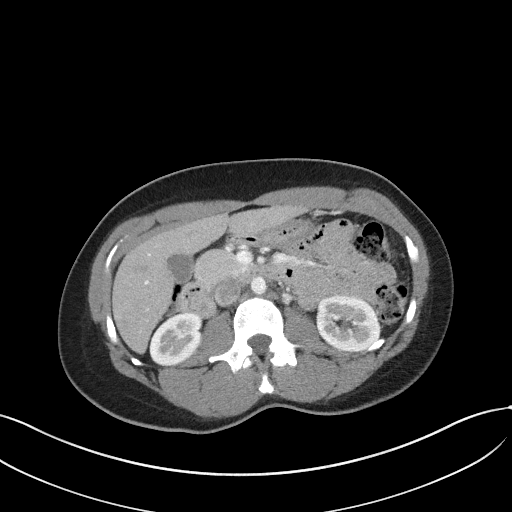
[im 67/101  bone]
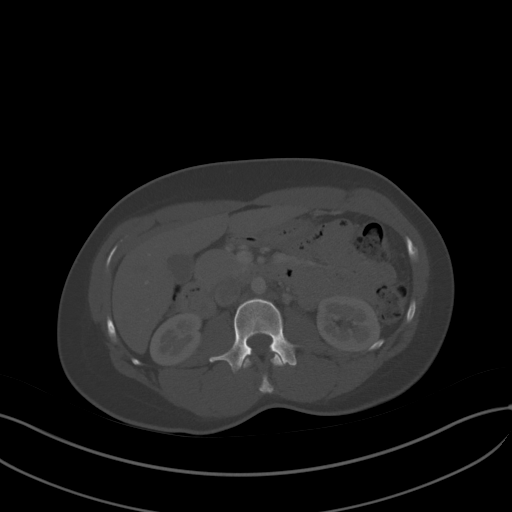
[im 71/101  soft-tissue]
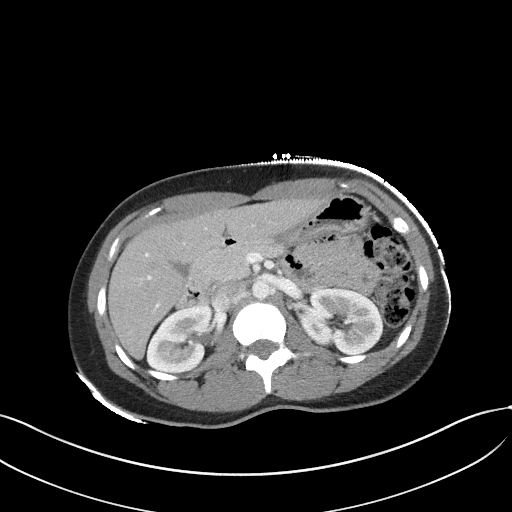
[im 80/101  soft-tissue]
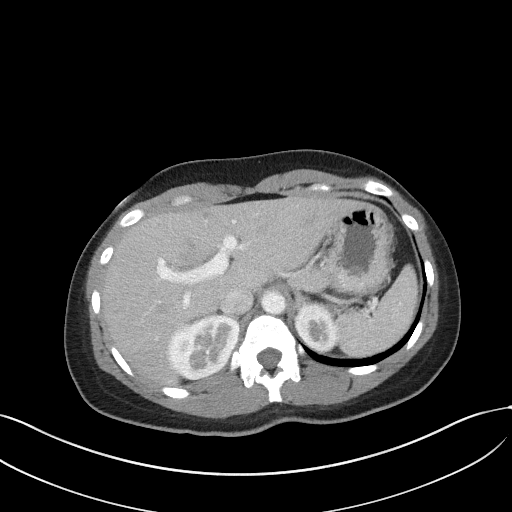
[im 88/101  soft-tissue]
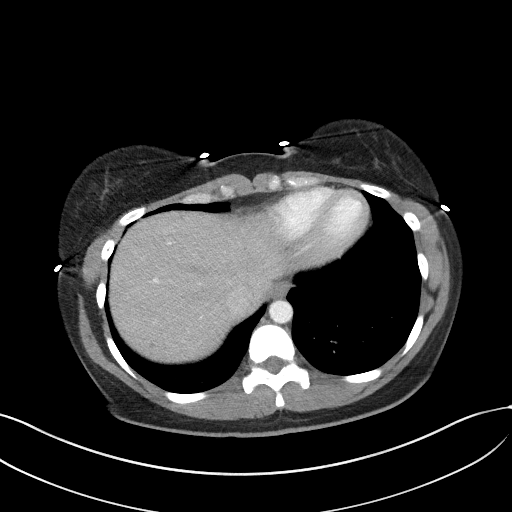
[im 96/101  soft-tissue]
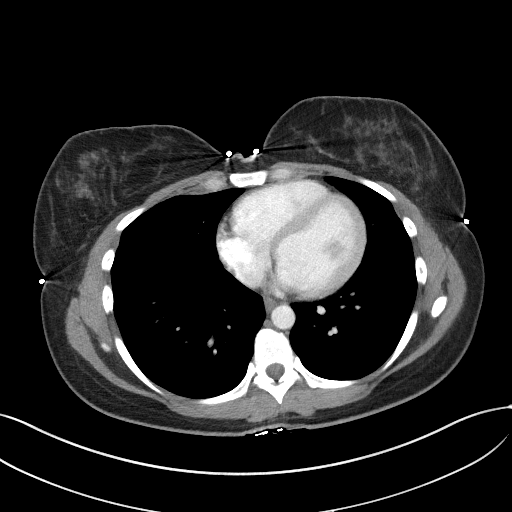

[Series 5: coronal st · coronal · 0.81mm/px · 3 of 88 slices shown]
[im 30/88  soft-tissue]
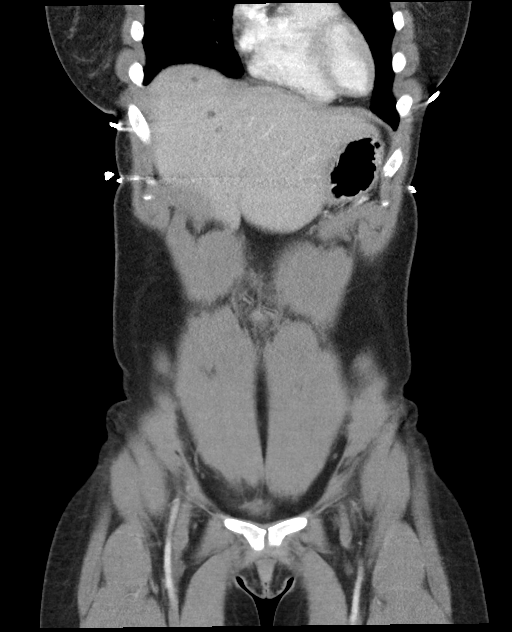
[im 39/88  soft-tissue]
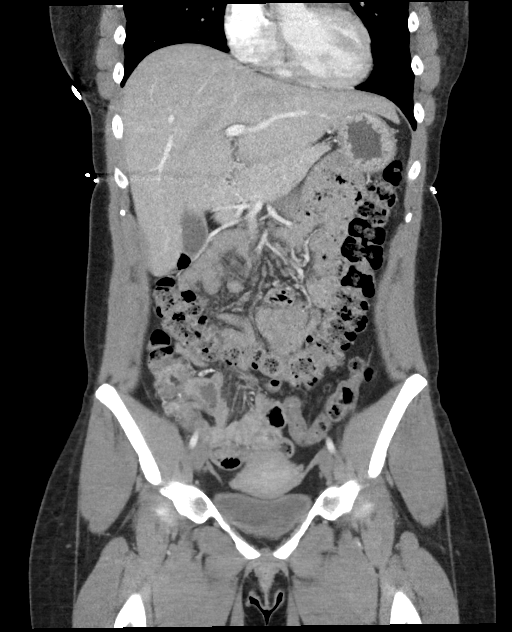
[im 49/88  soft-tissue]
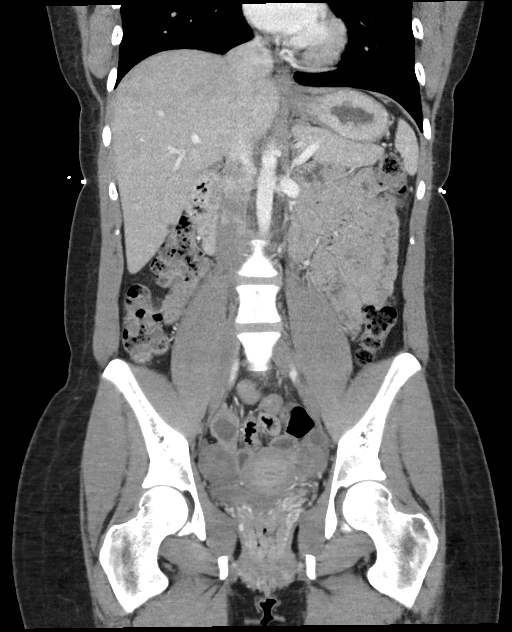

[16 of 46 positions shown; findings below may reference images not displayed]

RADIATION DOSE REDUCTION: This exam was performed according to the
departmental dose-optimization program which includes automated
exposure control, adjustment of the mA and/or kV according to
patient size and/or use of iterative reconstruction technique.

CONTRAST:  80mL OMNIPAQUE IOHEXOL 300 MG/ML  SOLN
FINDINGS: Lower chest:  None similar

Hepatobiliary: Scattered subcentimeter cystic densities mainly in
the right lobe liver.No evidence of biliary obstruction or stone.

Pancreas: Unremarkable.

Spleen: Unremarkable.

Adrenals/Urinary Tract: Negative adrenals. No hydronephrosis or
stone. Tiny presumed cyst at the lower pole left kidney.
Unremarkable bladder.

Stomach/Bowel: No obstruction. No appendicitis. The appendix is best
seen on coronal reformats. No convincing bowel inflammation
diffusely.

Vascular/Lymphatic: No acute vascular abnormality. No mass or
adenopathy.

Reproductive:Symmetric ovaries and unremarkable uterus.

Other: No ascites or pneumoperitoneum.

Musculoskeletal: No acute abnormalities.
IMPRESSION: No acute finding.  No appendicitis.

## 2023-12-22 ENCOUNTER — Emergency Department
Admission: EM | Admit: 2023-12-22 | Discharge: 2023-12-22 | Disposition: A | Discharge status: Home / Self Care | Payer: BLUE CROSS/BLUE SHIELD | Attending: Emergency Medicine | Admitting: Emergency Medicine

## 2023-12-22 ENCOUNTER — Other Ambulatory Visit: Payer: Self-pay

## 2023-12-22 DIAGNOSIS — L509 Urticaria, unspecified: Secondary | ICD-10-CM | POA: Diagnosis present

## 2023-12-22 MED ORDER — FAMOTIDINE 20 MG PO TABS: 20.0000 mg | ORAL_TABLET | Freq: Once | ORAL | Status: DC

## 2023-12-22 MED ORDER — DEXAMETHASONE SODIUM PHOSPHATE 10 MG/ML IJ SOLN
10.0000 mg | Freq: Once | INTRAMUSCULAR | Status: AC
  Administered 2023-12-22: 10 mg via INTRAMUSCULAR
  Filled 2023-12-22: qty 1

## 2023-12-22 MED ORDER — PREDNISONE 10 MG PO TABS: ORAL_TABLET | ORAL | 0 refills | Status: AC

## 2023-12-22 MED ORDER — DIPHENHYDRAMINE HCL 25 MG PO CAPS
50.0000 mg | ORAL_CAPSULE | Freq: Once | ORAL | Status: AC
  Administered 2023-12-22: 50 mg via ORAL
  Filled 2023-12-22: qty 2

## 2023-12-22 MED ORDER — PREDNISONE 20 MG PO TABS
60.0000 mg | ORAL_TABLET | Freq: Once | ORAL | Status: AC
  Administered 2023-12-22: 60 mg via ORAL
  Filled 2023-12-22: qty 3

## 2023-12-22 MED ORDER — HYDROXYZINE HCL 25 MG PO TABS: 25.0000 mg | ORAL_TABLET | Freq: Once | ORAL | Status: DC

## 2023-12-22 MED ORDER — FAMOTIDINE 20 MG PO TABS: 20.0000 mg | ORAL_TABLET | Freq: Two times a day (BID) | ORAL | 0 refills | Status: DC

## 2023-12-22 MED ORDER — FAMOTIDINE 20 MG PO TABS: 20.0000 mg | ORAL_TABLET | Freq: Two times a day (BID) | ORAL | 0 refills | Status: AC

## 2023-12-22 MED ORDER — FAMOTIDINE 20 MG PO TABS
20.0000 mg | ORAL_TABLET | Freq: Once | ORAL | Status: AC
  Administered 2023-12-22: 20 mg via ORAL
  Filled 2023-12-22: qty 1

## 2023-12-22 NOTE — ED Triage Notes (Signed)
Pt to ED via POV c/o hives. pt was laying in bed at around 330am when she started getting hives on her right arm. Has extended upward. Denies using anything. Denied SHOB, CP. Airway intact, able to speak in complete sentences

## 2023-12-22 NOTE — Discharge Instructions (Signed)
Call make an appointment with your primary care provider if any continued problems or concerns.  A prescription for both famotidine and prednisone was sent to the pharmacy for you to begin taking.  Also obtain Benadryl over-the-counter and take 1 or 2 every 6 hours as needed for itching.  If hives continue you may want to make an appointment with your primary care provider to get a referral for allergy testing.

## 2023-12-22 NOTE — ED Provider Notes (Signed)
[facility] Provider Note    Event Date/Time   First MD Initiated Contact with Patient 12/22/23 0745     (approximate)   History   Urticaria   HPI  Kathleen Hardy is a 38 y.o. female presents to the ED with complaint of hives that started while she was lying in bed last night.  Patient states that initially began on her arms and she also has 1 area on her left thigh.  She denies any difficulty breathing or shortness of breath.  No new allergens that she is aware of.     Physical Exam   Triage Vital Signs: ED Triage Vitals  Encounter Vitals Group     BP 12/22/23 0351 136/83     Systolic BP Percentile --      Diastolic BP Percentile --      Pulse Rate 12/22/23 0351 92     Resp 12/22/23 0351 18     Temp 12/22/23 0351 98.6 F (37 C)     Temp Source 12/22/23 0351 Oral     SpO2 12/22/23 0351 99 %     Weight 12/22/23 0345 175 lb (79.4 kg)     Height 12/22/23 0345 5\' 6"  (1.676 m)     Head Circumference --      Peak Flow --      Pain Score 12/22/23 0345 7     Pain Loc --      Pain Education --      Exclude from Growth Chart --     Most recent vital signs: Vitals:   12/22/23 0351 12/22/23 0854  BP: 136/83 136/81  Pulse: 92 71  Resp: 18 18  Temp: 98.6 F (37 C)   SpO2: 99% 100%     General: Awake, no distress.  Able speak in complete sentences without any difficulty. CV:  Good peripheral perfusion.  Heart rate and rate rhythm. Resp:  Normal effort.  Lungs are clear bilaterally. Abd:  No distention.  Other:  Urticarial rash with irregular erythematous areas to forearms bilaterally.  No edema noted oral mucosa and uvula is midline.  Speech is normal.   ED Results / Procedures / Treatments   Labs (all labs ordered are listed, but only abnormal results are displayed) Labs Reviewed - No data to display    PROCEDURES:  Critical Care performed:   Procedures   MEDICATIONS ORDERED IN ED: Medications  diphenhydrAMINE (BENADRYL)  capsule 50 mg (50 mg Oral Given 12/22/23 0509)  predniSONE (DELTASONE) tablet 60 mg (60 mg Oral Given 12/22/23 0509)  famotidine (PEPCID) tablet 20 mg (20 mg Oral Given 12/22/23 0510)  dexamethasone (DECADRON) injection 10 mg (10 mg Intramuscular Given 12/22/23 0852)     IMPRESSION / MDM / ASSESSMENT AND PLAN / ED COURSE  I reviewed the triage vital signs and the nursing notes.   Differential diagnosis includes, but is not limited to, allergic dermatitis, contact dermatitis, urticaria.    Patient is here for hives that started this morning.  While in the ED patient was given prednisone and pepcid and was feeling better at the time of exam.  A prescription was sent to the pharmacy for continued pepcid and prednisone.  She is aware that she may take benadryl for itching as needed.  Follow up with her PCP and  if continued problems then allergy testing may be needed.      Patient's presentation is most consistent with acute, uncomplicated illness.  FINAL CLINICAL IMPRESSION(S) / ED DIAGNOSES  Final diagnoses:  Urticaria     Rx / DC Orders   ED Discharge Orders          Ordered    famotidine (PEPCID) 20 MG tablet  2 times daily,   Status:  Discontinued        12/22/23 0825    predniSONE (DELTASONE) 10 MG tablet        12/22/23 0825    famotidine (PEPCID) 20 MG tablet  2 times daily        12/22/23 5093             Note:  This document was prepared using Dragon voice recognition software and may include unintentional dictation errors.   Tommi Rumps, PA-C 12/22/23 1510    Sharman Cheek, MD 12/23/23 [phone]

## 2023-12-28 ENCOUNTER — Encounter: Payer: Self-pay | Admitting: Nurse Practitioner

## 2023-12-28 ENCOUNTER — Ambulatory Visit: Payer: BLUE CROSS/BLUE SHIELD | Admitting: Nurse Practitioner

## 2023-12-28 DIAGNOSIS — Z113 Encounter for screening for infections with a predominantly sexual mode of transmission: Secondary | ICD-10-CM

## 2023-12-28 LAB — HEPATITIS B SURFACE ANTIGEN

## 2023-12-28 LAB — WET PREP FOR TRICH, YEAST, CLUE
Trichomonas Exam: NEGATIVE
Yeast Exam: NEGATIVE

## 2023-12-28 LAB — HM HEPATITIS C SCREENING LAB: HM Hepatitis Screen: NEGATIVE

## 2023-12-28 LAB — HM HIV SCREENING LAB: HM HIV Screening: NEGATIVE

## 2023-12-28 NOTE — Progress Notes (Signed)
 Pt is here for STD screening , Wet prep results reviewed with pt, no treatment required per standing order. Condoms declined. Sonda Primes, RN.
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
## 2024-01-01 LAB — GONOCOCCUS CULTURE

## 2024-02-17 ENCOUNTER — Emergency Department: Payer: Self-pay

## 2024-02-17 ENCOUNTER — Observation Stay
Admission: EM | Admit: 2024-02-17 | Discharge: 2024-02-18 | Disposition: A | Discharge status: Home / Self Care | Payer: Self-pay | Attending: Obstetrics and Gynecology | Admitting: Obstetrics and Gynecology

## 2024-02-17 ENCOUNTER — Encounter: Payer: Self-pay | Admitting: *Deleted

## 2024-02-17 ENCOUNTER — Other Ambulatory Visit: Payer: Self-pay

## 2024-02-17 DIAGNOSIS — K661 Hemoperitoneum: Secondary | ICD-10-CM | POA: Insufficient documentation

## 2024-02-17 DIAGNOSIS — O00101 Right tubal pregnancy without intrauterine pregnancy: Principal | ICD-10-CM | POA: Insufficient documentation

## 2024-02-17 DIAGNOSIS — Z87891 Personal history of nicotine dependence: Secondary | ICD-10-CM | POA: Insufficient documentation

## 2024-02-17 DIAGNOSIS — R1030 Lower abdominal pain, unspecified: Secondary | ICD-10-CM | POA: Diagnosis present

## 2024-02-17 DIAGNOSIS — O009 Unspecified ectopic pregnancy without intrauterine pregnancy: Secondary | ICD-10-CM | POA: Diagnosis present

## 2024-02-17 LAB — URINALYSIS, ROUTINE W REFLEX MICROSCOPIC
Bacteria, UA: NONE SEEN
Bilirubin Urine: NEGATIVE
Glucose, UA: NEGATIVE mg/dL
Hgb urine dipstick: NEGATIVE
Ketones, ur: 5 mg/dL — AB
Nitrite: NEGATIVE
Protein, ur: NEGATIVE mg/dL
Specific Gravity, Urine: 1.025 (ref 1.005–1.030)
pH: 5 (ref 5.0–8.0)

## 2024-02-17 LAB — CBC
HCT: 36 % (ref 36.0–46.0)
Hemoglobin: 12.7 g/dL (ref 12.0–15.0)
MCH: 31.1 pg (ref 26.0–34.0)
MCHC: 35.3 g/dL (ref 30.0–36.0)
MCV: 88.2 fL (ref 80.0–100.0)
Platelets: 386 10*3/uL (ref 150–400)
RBC: 4.08 MIL/uL (ref 3.87–5.11)
RDW: 12.5 % (ref 11.5–15.5)
WBC: 13.3 10*3/uL — ABNORMAL HIGH (ref 4.0–10.5)
nRBC: 0 % (ref 0.0–0.2)

## 2024-02-17 LAB — COMPREHENSIVE METABOLIC PANEL
ALT: 14 U/L (ref 0–44)
AST: 18 U/L (ref 15–41)
Albumin: 4.3 g/dL (ref 3.5–5.0)
Alkaline Phosphatase: 40 U/L (ref 38–126)
Anion gap: 7 (ref 5–15)
BUN: 12 mg/dL (ref 6–20)
CO2: 23 mmol/L (ref 22–32)
Calcium: 9.2 mg/dL (ref 8.9–10.3)
Chloride: 103 mmol/L (ref 98–111)
Creatinine, Ser: 0.94 mg/dL (ref 0.44–1.00)
GFR, Estimated: >60 mL/min (ref >60)
Glucose, Bld: 122 mg/dL — ABNORMAL HIGH (ref 70–99)
Potassium: 3.5 mmol/L (ref 3.5–5.1)
Sodium: 133 mmol/L — ABNORMAL LOW (ref 135–145)
Total Bilirubin: 0.8 mg/dL (ref 0.0–1.2)
Total Protein: 7.7 g/dL (ref 6.5–8.1)

## 2024-02-17 LAB — HCG, QUANTITATIVE, PREGNANCY: hCG, Beta Chain, Quant, S: 23683 m[IU]/mL — ABNORMAL HIGH (ref <5)

## 2024-02-17 LAB — POC URINE PREG, ED: Preg Test, Ur: POSITIVE — AB

## 2024-02-17 NOTE — ED Triage Notes (Signed)
 Pt to triage via wheelchair.  Pt has abd pain cramping  pt is approx [redacted] weeks pregnant.  No vag bleeding.  Denies urinary sx   pt alert.

## 2024-02-17 NOTE — ED Provider Notes (Incomplete)
 [facility] Provider Note    Event Date/Time   First MD Initiated Contact with Patient 02/17/24 2349     (approximate)   History   Abdominal Pain   HPI  Kathleen Hardy is a 38 y.o. female   Past medical history of ***    Independent Historian contributed to assessment above: ***  External Medical Documents Reviewed: ***      Physical Exam   Triage Vital Signs: ED Triage Vitals  Encounter Vitals Group     BP 02/17/24 2145 (!) 145/82     Systolic BP Percentile --      Diastolic BP Percentile --      Pulse Rate 02/17/24 2145 86     Resp 02/17/24 2145 18     Temp 02/17/24 2149 98.2 F (36.8 C)     Temp Source 02/17/24 2145 Oral     SpO2 02/17/24 2145 100 %     Weight 02/17/24 2146 176 lb (79.8 kg)     Height 02/17/24 2146 5\' 6"  (1.676 m)     Head Circumference --      Peak Flow --      Pain Score 02/17/24 2146 9     Pain Loc --      Pain Education --      Exclude from Growth Chart --     Most recent vital signs: Vitals:   02/17/24 2145 02/17/24 2149  BP: (!) 145/82   Pulse: 86   Resp: 18   Temp:  98.2 F (36.8 C)  SpO2: 100%     General: Awake, no distress. *** CV:  Good peripheral perfusion. *** Resp:  Normal effort. *** Abd:  No distention. *** Other:  ***   ED Results / Procedures / Treatments   Labs (all labs ordered are listed, but only abnormal results are displayed) Labs Reviewed  COMPREHENSIVE METABOLIC PANEL - Abnormal; Notable for the following components:      Result Value   Sodium 133 (*)    Glucose, Bld 122 (*)    All other components within normal limits  CBC - Abnormal; Notable for the following components:   WBC 13.3 (*)    All other components within normal limits  URINALYSIS, ROUTINE W REFLEX MICROSCOPIC - Abnormal; Notable for the following components:   Color, Urine YELLOW (*)    APPearance HAZY (*)    Ketones, ur 5 (*)    Leukocytes,Ua TRACE (*)    All other components within normal limits   HCG, QUANTITATIVE, PREGNANCY - Abnormal; Notable for the following components:   hCG, Beta Chain, Quant, S 23,683 (*)    All other components within normal limits  POC URINE PREG, ED - Abnormal; Notable for the following components:   Preg Test, Ur Positive (*)    All other components within normal limits     I ordered and reviewed the above labs they are notable for ***  EKG  ED ECG REPORT I, Pilar Jarvis, the attending physician, personally viewed and interpreted this ECG.   Date: 02/17/2024  EKG Time: ***  Rate: ***  Rhythm: {ekg findings:315101}  Axis: ***  Intervals:{conduction defects:17367}  ST&T Change: ***    RADIOLOGY I independently reviewed and interpreted *** I also reviewed radiologist's formal read.   PROCEDURES:  Critical Care performed: {CriticalCareYesNo:19197::"Yes, see critical care procedure note(s)","No"}  Procedures   MEDICATIONS ORDERED IN ED: Medications - No data to display  External physician / consultants:  I spoke with ***  regarding care plan for this patient.   IMPRESSION / MDM / ASSESSMENT AND PLAN / ED COURSE  I reviewed the triage vital signs and the nursing notes.                                Patient's presentation is most consistent with {EM COPA:27473}  Differential diagnosis includes, but is not limited to, ***   ***The patient is on the cardiac monitor to evaluate for evidence of arrhythmia and/or significant heart rate changes.  MDM:  ***  I considered hospitalization for admission or observation ***        FINAL CLINICAL IMPRESSION(S) / ED DIAGNOSES   Final diagnoses:  None     Rx / DC Orders   ED Discharge Orders     None        Note:  This document was prepared using Dragon voice recognition software and may include unintentional dictation errors.

## 2024-02-17 NOTE — ED Provider Notes (Signed)
 [facility] Provider Note    Event Date/Time   First MD Initiated Contact with Patient 02/17/24 2349     (approximate)   History   Abdominal Pain   HPI  Kathleen Hardy is a 38 y.o. female   Past medical history of PCOS otherwise healthy who presents to the emergency department with abdominal cramping pain in the lower abdomen.  She knows she is pregnant with a home pregnancy test confirmed by her primary doctor recently.  Last known menstrual period was sometime in January.  She denies vaginal bleeding or discharge.  No other acute medical complaints.       Physical Exam   Triage Vital Signs: ED Triage Vitals  Encounter Vitals Group     BP 02/17/24 2145 (!) 145/82     Systolic BP Percentile --      Diastolic BP Percentile --      Pulse Rate 02/17/24 2145 86     Resp 02/17/24 2145 18     Temp 02/17/24 2149 98.2 F (36.8 C)     Temp Source 02/17/24 2145 Oral     SpO2 02/17/24 2145 100 %     Weight 02/17/24 2146 176 lb (79.8 kg)     Height 02/17/24 2146 5\' 6"  (1.676 m)     Head Circumference --      Peak Flow --      Pain Score 02/17/24 2146 9     Pain Loc --      Pain Education --      Exclude from Growth Chart --     Most recent vital signs: Vitals:   02/17/24 2145 02/17/24 2149  BP: (!) 145/82   Pulse: 86   Resp: 18   Temp:  98.2 F (36.8 C)  SpO2: 100%     General: Awake, no distress.  CV:  Good peripheral perfusion.  Resp:  Normal effort.  Abd:  No distention.  Other:  Mild abdominal tenderness in the lower quadrants without rigidity or guarding.   ED Results / Procedures / Treatments   Labs (all labs ordered are listed, but only abnormal results are displayed) Labs Reviewed  COMPREHENSIVE METABOLIC PANEL - Abnormal; Notable for the following components:      Result Value   Sodium 133 (*)    Glucose, Bld 122 (*)    All other components within normal limits  CBC - Abnormal; Notable for the following components:    WBC 13.3 (*)    All other components within normal limits  URINALYSIS, ROUTINE W REFLEX MICROSCOPIC - Abnormal; Notable for the following components:   Color, Urine YELLOW (*)    APPearance HAZY (*)    Ketones, ur 5 (*)    Leukocytes,Ua TRACE (*)    All other components within normal limits  HCG, QUANTITATIVE, PREGNANCY - Abnormal; Notable for the following components:   hCG, Beta Chain, Quant, S 23,683 (*)    All other components within normal limits  POC URINE PREG, ED - Abnormal; Notable for the following components:   Preg Test, Ur Positive (*)    All other components within normal limits     I ordered and reviewed the above labs they are notable for quantitative hCG 23,000    PROCEDURES:  Critical Care performed: Yes, see critical care procedure note(s)  .Critical Care  Performed by: Pilar Jarvis, MD Authorized by: Pilar Jarvis, MD   Critical care provider statement:    Critical care time (minutes):  30  Critical care was time spent personally by me on the following activities:  Development of treatment plan with patient or surrogate, discussions with consultants, evaluation of patient's response to treatment, examination of patient, ordering and review of laboratory studies, ordering and review of radiographic studies, ordering and performing treatments and interventions, pulse oximetry, re-evaluation of patient's condition and review of old charts    MEDICATIONS ORDERED IN ED: Medications - No data to display  External physician / consultants:  I spoke with Dr. Dalbert Garnet of gynecology regarding care plan for this patient.   IMPRESSION / MDM / ASSESSMENT AND PLAN / ED COURSE  I reviewed the triage vital signs and the nursing notes.                                Patient's presentation is most consistent with acute presentation with potential threat to life or bodily function.  Differential diagnosis includes, but is not limited to, ectopic pregnancy, urinary tract  infection, intra-abdominal infection like appendicitis, PID   The patient is on the cardiac monitor to evaluate for evidence of arrhythmia and/or significant heart rate changes.  MDM:    Lower abdominal cramping positive pregnancy test and an ultrasound confirming live ectopic pregnancy dated around 6 weeks.  She has no vaginal bleeding.  She is hemodynamically stable.  Slightly hypertensive.  Appears comfortable and after the ultrasound her cramping pain has actually resolved.  I spoke to her about her diagnosis.  I have paged on-call gynecologist Dr. Dalbert Garnet for emergency consultation.      FINAL CLINICAL IMPRESSION(S) / ED DIAGNOSES   Final diagnoses:  Right tubal pregnancy without intrauterine pregnancy     Rx / DC Orders   ED Discharge Orders     None        Note:  This document was prepared using Dragon voice recognition software and may include unintentional dictation errors.    Pilar Jarvis, MD 02/18/24 Burna Mortimer

## 2024-02-18 ENCOUNTER — Other Ambulatory Visit: Payer: Self-pay

## 2024-02-18 ENCOUNTER — Observation Stay: Payer: Self-pay | Admitting: Anesthesiology

## 2024-02-18 ENCOUNTER — Encounter
Admission: EM | Disposition: A | Discharge status: Home / Self Care | Payer: Self-pay | Source: Home / Self Care | Attending: Emergency Medicine

## 2024-02-18 DIAGNOSIS — O009 Unspecified ectopic pregnancy without intrauterine pregnancy: Secondary | ICD-10-CM | POA: Diagnosis present

## 2024-02-18 HISTORY — PX: DIAGNOSTIC LAPAROSCOPY WITH REMOVAL OF ECTOPIC PREGNANCY: SHX6449

## 2024-02-18 HISTORY — PX: CHROMOPERTUBATION: SHX6288

## 2024-02-18 LAB — CBC
HCT: 35.5 % — ABNORMAL LOW (ref 36.0–46.0)
Hemoglobin: 12.6 g/dL (ref 12.0–15.0)
MCH: 30.5 pg (ref 26.0–34.0)
MCHC: 35.5 g/dL (ref 30.0–36.0)
MCV: 86 fL (ref 80.0–100.0)
Platelets: 399 10*3/uL (ref 150–400)
RBC: 4.13 MIL/uL (ref 3.87–5.11)
RDW: 12.4 % (ref 11.5–15.5)
WBC: 15.3 10*3/uL — ABNORMAL HIGH (ref 4.0–10.5)
nRBC: 0 % (ref 0.0–0.2)

## 2024-02-18 LAB — TYPE AND SCREEN
ABO/RH(D): O POS
Antibody Screen: NEGATIVE

## 2024-02-18 LAB — ABO/RH: ABO/RH(D): O POS

## 2024-02-18 SURGERY — LAPAROSCOPY, WITH ECTOPIC PREGNANCY SURGICAL TREATMENT
Anesthesia: General | Laterality: Right

## 2024-02-18 MED ORDER — ROCURONIUM BROMIDE 10 MG/ML (PF) SYRINGE
PREFILLED_SYRINGE | INTRAVENOUS | Status: AC
  Filled 2024-02-18: qty 10

## 2024-02-18 MED ORDER — ACETAMINOPHEN 10 MG/ML IV SOLN
INTRAVENOUS | Status: DC | PRN
  Administered 2024-02-18: 1000 mg via INTRAVENOUS

## 2024-02-18 MED ORDER — DEXAMETHASONE SODIUM PHOSPHATE 10 MG/ML IJ SOLN
INTRAMUSCULAR | Status: AC
  Filled 2024-02-18: qty 1

## 2024-02-18 MED ORDER — SODIUM CHLORIDE (PF) 0.9 % IJ SOLN
INTRAMUSCULAR | Status: DC | PRN
  Administered 2024-02-18: 20 mL

## 2024-02-18 MED ORDER — ACETAMINOPHEN 10 MG/ML IV SOLN
INTRAVENOUS | Status: AC
  Filled 2024-02-18: qty 100

## 2024-02-18 MED ORDER — SUGAMMADEX SODIUM 200 MG/2ML IV SOLN
INTRAVENOUS | Status: AC
  Filled 2024-02-18: qty 2

## 2024-02-18 MED ORDER — FENTANYL CITRATE (PF) 100 MCG/2ML IJ SOLN
INTRAMUSCULAR | Status: AC
  Filled 2024-02-18: qty 2

## 2024-02-18 MED ORDER — ALBUMIN HUMAN 5 % IV SOLN
INTRAVENOUS | Status: AC
  Filled 2024-02-18: qty 250

## 2024-02-18 MED ORDER — GLYCOPYRROLATE 0.2 MG/ML IJ SOLN
INTRAMUSCULAR | Status: AC
  Filled 2024-02-18: qty 1

## 2024-02-18 MED ORDER — ROCURONIUM BROMIDE 100 MG/10ML IV SOLN
INTRAVENOUS | Status: DC | PRN
  Administered 2024-02-18: 40 mg via INTRAVENOUS

## 2024-02-18 MED ORDER — BUPIVACAINE HCL (PF) 0.5 % IJ SOLN
INTRAMUSCULAR | Status: AC
  Filled 2024-02-18: qty 30

## 2024-02-18 MED ORDER — FENTANYL CITRATE (PF) 100 MCG/2ML IJ SOLN
INTRAMUSCULAR | Status: DC | PRN
  Administered 2024-02-18: 50 ug via INTRAVENOUS
  Administered 2024-02-18: 25 ug via INTRAVENOUS
  Administered 2024-02-18: 75 ug via INTRAVENOUS

## 2024-02-18 MED ORDER — LIDOCAINE HCL (CARDIAC) PF 100 MG/5ML IV SOSY
PREFILLED_SYRINGE | INTRAVENOUS | Status: DC | PRN
  Administered 2024-02-18: 100 mg via INTRAVENOUS

## 2024-02-18 MED ORDER — OXYCODONE HCL 5 MG PO TABS: 5.0000 mg | ORAL_TABLET | ORAL | 0 refills | Status: AC | PRN

## 2024-02-18 MED ORDER — FENTANYL CITRATE (PF) 100 MCG/2ML IJ SOLN
INTRAMUSCULAR | Status: AC
Start: 2024-02-18 — End: ?
  Filled 2024-02-18: qty 2

## 2024-02-18 MED ORDER — MIDAZOLAM HCL 2 MG/2ML IJ SOLN
INTRAMUSCULAR | Status: AC
  Filled 2024-02-18: qty 2

## 2024-02-18 MED ORDER — POVIDONE-IODINE 10 % EX SWAB
2.0000 | Freq: Once | CUTANEOUS | Status: DC
  Filled 2024-02-18: qty 4

## 2024-02-18 MED ORDER — LIDOCAINE HCL (PF) 2 % IJ SOLN
INTRAMUSCULAR | Status: AC
  Filled 2024-02-18: qty 5

## 2024-02-18 MED ORDER — OXYCODONE HCL 5 MG PO TABS
5.0000 mg | ORAL_TABLET | Freq: Once | ORAL | Status: AC | PRN
  Administered 2024-02-18: 5 mg via ORAL

## 2024-02-18 MED ORDER — ONDANSETRON HCL 4 MG/2ML IJ SOLN
INTRAMUSCULAR | Status: AC
  Filled 2024-02-18: qty 2

## 2024-02-18 MED ORDER — OXYCODONE HCL 5 MG PO TABS
ORAL_TABLET | ORAL | Status: AC
  Filled 2024-02-18: qty 1

## 2024-02-18 MED ORDER — KETOROLAC TROMETHAMINE 30 MG/ML IJ SOLN
INTRAMUSCULAR | Status: AC
  Filled 2024-02-18: qty 1

## 2024-02-18 MED ORDER — MIDAZOLAM HCL 2 MG/2ML IJ SOLN
INTRAMUSCULAR | Status: DC | PRN
  Administered 2024-02-18: 2 mg via INTRAVENOUS

## 2024-02-18 MED ORDER — GLYCOPYRROLATE 0.2 MG/ML IJ SOLN
INTRAMUSCULAR | Status: DC | PRN
  Administered 2024-02-18: .2 mg via INTRAVENOUS

## 2024-02-18 MED ORDER — PROPOFOL 10 MG/ML IV BOLUS
INTRAVENOUS | Status: AC
  Filled 2024-02-18: qty 20

## 2024-02-18 MED ORDER — GABAPENTIN 300 MG PO CAPS: 300.0000 mg | ORAL_CAPSULE | Freq: Every day | ORAL | 0 refills | Status: AC

## 2024-02-18 MED ORDER — ONDANSETRON HCL 4 MG/2ML IJ SOLN
INTRAMUSCULAR | Status: DC | PRN
  Administered 2024-02-18: 4 mg via INTRAVENOUS

## 2024-02-18 MED ORDER — OXYCODONE HCL 5 MG PO TABS
5.0000 mg | ORAL_TABLET | Freq: Once | ORAL | Status: AC
  Administered 2024-02-18: 5 mg via ORAL
  Filled 2024-02-18: qty 1

## 2024-02-18 MED ORDER — BUPIVACAINE HCL 0.5 % IJ SOLN
INTRAMUSCULAR | Status: DC | PRN
  Administered 2024-02-18: 30 mL

## 2024-02-18 MED ORDER — PROPOFOL 10 MG/ML IV BOLUS
INTRAVENOUS | Status: DC | PRN
  Administered 2024-02-18: 150 mg via INTRAVENOUS

## 2024-02-18 MED ORDER — CELECOXIB 200 MG PO CAPS: 200.0000 mg | ORAL_CAPSULE | Freq: Two times a day (BID) | ORAL | 0 refills | Status: AC

## 2024-02-18 MED ORDER — DEXAMETHASONE SODIUM PHOSPHATE 10 MG/ML IJ SOLN
INTRAMUSCULAR | Status: DC | PRN
  Administered 2024-02-18: 8 mg via INTRAVENOUS

## 2024-02-18 MED ORDER — OXYCODONE HCL 5 MG/5ML PO SOLN: 5.0000 mg | Freq: Once | ORAL | Status: AC | PRN

## 2024-02-18 MED ORDER — SUGAMMADEX SODIUM 200 MG/2ML IV SOLN
INTRAVENOUS | Status: DC | PRN
  Administered 2024-02-18: 200 mg via INTRAVENOUS

## 2024-02-18 MED ORDER — METHYLENE BLUE (ANTIDOTE) 1 % IV SOLN
INTRAVENOUS | Status: AC
  Filled 2024-02-18: qty 10

## 2024-02-18 MED ORDER — NITROGLYCERIN IN D5W 200-5 MCG/ML-% IV SOLN
INTRAVENOUS | Status: AC
  Filled 2024-02-18: qty 250

## 2024-02-18 MED ORDER — ACETAMINOPHEN 500 MG PO TABS
1000.0000 mg | ORAL_TABLET | Freq: Once | ORAL | Status: AC
  Administered 2024-02-18: 1000 mg via ORAL
  Filled 2024-02-18: qty 2

## 2024-02-18 MED ORDER — ACETAMINOPHEN EXTRA STRENGTH 500 MG PO TABS
1000.0000 mg | ORAL_TABLET | Freq: Four times a day (QID) | ORAL | 0 refills | Status: AC
Start: 2024-02-18 — End: 2024-02-21

## 2024-02-18 MED ORDER — PHENYLEPHRINE 80 MCG/ML (10ML) SYRINGE FOR IV PUSH (FOR BLOOD PRESSURE SUPPORT)
PREFILLED_SYRINGE | INTRAVENOUS | Status: DC | PRN
  Administered 2024-02-18 (×3): 80 ug via INTRAVENOUS

## 2024-02-18 MED ORDER — FENTANYL CITRATE (PF) 100 MCG/2ML IJ SOLN
25.0000 ug | INTRAMUSCULAR | Status: DC | PRN
  Administered 2024-02-18: 25 ug via INTRAVENOUS
  Administered 2024-02-18: 50 ug via INTRAVENOUS

## 2024-02-18 MED ORDER — IBUPROFEN 600 MG PO TABS
600.0000 mg | ORAL_TABLET | Freq: Once | ORAL | Status: AC
  Administered 2024-02-18: 600 mg via ORAL
  Filled 2024-02-18: qty 1

## 2024-02-18 MED ORDER — LACTATED RINGERS IV SOLN: INTRAVENOUS | Status: DC

## 2024-02-18 MED ORDER — KETOROLAC TROMETHAMINE 30 MG/ML IJ SOLN
INTRAMUSCULAR | Status: DC | PRN
  Administered 2024-02-18: 30 mg via INTRAVENOUS

## 2024-02-18 SURGICAL SUPPLY — 49 items
APPLICATOR ARISTA FLEXITIP XL (MISCELLANEOUS) ×2 IMPLANT
BAG URINE DRAIN 2000ML AR STRL (UROLOGICAL SUPPLIES) ×2 IMPLANT
BLADE SURG SZ11 CARB STEEL (BLADE) ×2 IMPLANT
CATH URTH 16FR FL 2W BLN LF (CATHETERS) ×2 IMPLANT
CORD MONOPOLAR M/FML 12FT (MISCELLANEOUS) ×2 IMPLANT
DERMABOND ADVANCED .7 DNX12 (GAUZE/BANDAGES/DRESSINGS) ×2 IMPLANT
DRAPE STERI POUCH LG 24X46 STR (DRAPES) IMPLANT
GAUZE 4X4 16PLY ~~LOC~~+RFID DBL (SPONGE) ×2 IMPLANT
GLOVE BIO SURGEON STRL SZ7 (GLOVE) ×4 IMPLANT
GLOVE INDICATOR 7.5 STRL GRN (GLOVE) ×2 IMPLANT
GLOVE SURG SYN 8.0 (GLOVE) ×2 IMPLANT
GLOVE SURG SYN 8.0 PF PI (GLOVE) ×2 IMPLANT
GOWN STRL REUS W/ TWL LRG LVL3 (GOWN DISPOSABLE) ×4 IMPLANT
GOWN STRL REUS W/ TWL XL LVL3 (GOWN DISPOSABLE) ×2 IMPLANT
GRASPER SUT TROCAR 14GX15 (MISCELLANEOUS) IMPLANT
HEMOSTAT ARISTA ABSORB 3G PWDR (HEMOSTASIS) IMPLANT
IRRIGATION STRYKERFLOW (MISCELLANEOUS) IMPLANT
IRRIGATOR STRYKERFLOW (MISCELLANEOUS) ×2 IMPLANT
IV NS 1000ML BAXH (IV SOLUTION) ×2 IMPLANT
KIT PINK PAD W/HEAD ARE REST (MISCELLANEOUS) ×2 IMPLANT
KIT PINK PAD W/HEAD ARM REST (MISCELLANEOUS) ×2 IMPLANT
KIT TURNOVER CYSTO (KITS) ×2 IMPLANT
L-HOOK LAP DISP 36CM (ELECTROSURGICAL) IMPLANT
LABEL OR SOLS (LABEL) ×2 IMPLANT
LHOOK LAP DISP 36CM (ELECTROSURGICAL) IMPLANT
LIGASURE VESSEL 5MM BLUNT TIP (ELECTROSURGICAL) IMPLANT
MANIFOLD NEPTUNE II (INSTRUMENTS) ×2 IMPLANT
MANIPULATOR UTERINE 4.5 ZUMI (MISCELLANEOUS) IMPLANT
NS IRRIG 500ML POUR BTL (IV SOLUTION) ×2 IMPLANT
PACK GYN LAPAROSCOPIC (MISCELLANEOUS) ×2 IMPLANT
PAD OB MATERNITY 11 LF (PERSONAL CARE ITEMS) ×2 IMPLANT
PAD PREP OB/GYN DISP 24X41 (PERSONAL CARE ITEMS) ×2 IMPLANT
SCISSORS METZENBAUM CVD 33 (INSTRUMENTS) IMPLANT
SCRUB CHG 4% DYNA-HEX 4OZ (MISCELLANEOUS) ×2 IMPLANT
SET TUBE SMOKE EVAC HIGH FLOW (TUBING) ×2 IMPLANT
SLEEVE Z-THREAD 5X100MM (TROCAR) ×2 IMPLANT
SOL PREP PVP 2OZ (MISCELLANEOUS) IMPLANT
SOLUTION PREP PVP 2OZ (MISCELLANEOUS) ×2 IMPLANT
STRIP CLOSURE SKIN 1/4X4 (GAUZE/BANDAGES/DRESSINGS) IMPLANT
SUT MNCRL AB 4-0 PS2 18 (SUTURE) ×2 IMPLANT
SYR 50ML LL SCALE MARK (SYRINGE) IMPLANT
SYR 5ML LL (SYRINGE) IMPLANT
SYS BAG RETRIEVAL 10MM (BASKET) ×2 IMPLANT
SYSTEM BAG RETRIEVAL 10MM (BASKET) IMPLANT
TRAP FLUID SMOKE EVACUATOR (MISCELLANEOUS) ×2 IMPLANT
TROCAR Z-THREAD FIOS 11X100 BL (TROCAR) IMPLANT
TROCAR Z-THREAD FIOS 5X100MM (TROCAR) ×2 IMPLANT
TUBING ART PRESS 48 MALE/FEM (TUBING) IMPLANT
WATER STERILE IRR 500ML POUR (IV SOLUTION) ×2 IMPLANT

## 2024-02-18 NOTE — Discharge Instructions (Signed)
      Laparoscopic Tubal Removal for Ectopic Pregnancy Discharge Instructions  Laparoscopic tubal removal is a procedure that removes the fallopian tube containing the ectopic pregnancy.  For the next three days, take ibuprofen and acetaminophen on a schedule, every 8 hours. You can take them together or you can intersperse them, and take one every four hours. I also gave you gabapentin for nighttime, to help you sleep and also to control pain. Take gabapentin medicines at night for at least the next 3 nights. You also have a narcotic, oxycodone, to take as needed if the above medicines don't help.  Postop constipation is a major cause of pain. Stay well hydrated, walk as you tolerate, and take over the counter senna as well as stool softeners if you need them.  RISKS AND COMPLICATIONS  Infection. Bleeding. Injury to surrounding organs. Anesthetic side effects. Failure of the procedure. Risks of future ectopic pregnancy on the other side PROCEDURE  You may be given a medicine to help you relax (sedative) before the procedure. You will be given a medicine to make you sleep (general anesthetic) during the procedure. A tube will be put down your throat to help your breath while under general anesthesia. Two small cuts (incisions) are made in the lower abdominal area and one incision is made near the belly button. Your abdominal area will be inflated with a safe gas (carbon dioxide). This helps give the surgeon room to operate, visualize, and helps the surgeon avoid other organs. A thin, lighted tube (laparoscope) with a camera attached is inserted into your abdomen through the incision near the belly button. Other small instruments are also inserted through the other abdominal incisions. The fallopian tube is located and are removed. After the fallopian tube is removed, the gas is released from the abdomen. The incisions will be closed with stitches (sutures), and Dermabond. A bandage may be  placed over the incisions. AFTER THE PROCEDURE  You will also have some mild abdominal discomfort for 3-7 days. You will be given pain medicine to ease any discomfort. As long as there are no problems, you may be allowed to go home. Someone will need to drive you home and be with you for at least 24 hours once home. You may have some mild discomfort in the throat. This is from the tube placed in your throat while you were sleeping. You may experience discomfort in the shoulder area from some trapped air between the liver and diaphragm. This sensation is normal and will slowly go away on its own. HOME CARE INSTRUCTIONS  Take all medicines as directed. Only take over-the-counter or prescription medicines for pain, discomfort, or fever as directed by your caregiver. Resume daily activities as directed. Showers are preferred over baths. You may resume sexual activities in 1 week or as directed. Do not drive while taking narcotics. SEEK MEDICAL CARE IF: . There is increasing abdominal pain. You feel lightheaded or faint. You have the chills. You have an oral temperature above 102 F (38.9 C). There is pus-like (purulent) drainage from any of the wounds. You are unable to pass gas or have a bowel movement. You feel sick to your stomach (nauseous) or throw up (vomit). MAKE SURE YOU:  Understand these instructions. Will watch your condition. Will get help right away if you are not doing well or get worse.  [employer] Patient Information 2013 [employer].    

## 2024-02-18 NOTE — Anesthesia Preprocedure Evaluation (Signed)
 Anesthesia Evaluation  Patient identified by MRN, date of birth, ID band Patient awake    Reviewed: Allergy & Precautions, NPO status , Patient's Chart, lab work & pertinent test results  Airway Mallampati: II  TM Distance: >3 FB Neck ROM: full    Dental  (+) Dental Advidsory Given, Teeth Intact   Pulmonary Current Smoker and Patient abstained from smoking.   Pulmonary exam normal        Cardiovascular negative cardio ROS Normal cardiovascular exam     Neuro/Psych  PSYCHIATRIC DISORDERS  Depression    negative neurological ROS     GI/Hepatic negative GI ROS, Neg liver ROS,,,  Endo/Other  negative endocrine ROS    Renal/GU      Musculoskeletal   Abdominal   Peds  Hematology negative hematology ROS (+)   Anesthesia Other Findings Past Medical History: No date: PCOS (polycystic ovarian syndrome)  Past Surgical History: No date: WISDOM TOOTH EXTRACTION  BMI    Body Mass Index: 28.41 kg/m      Reproductive/Obstetrics negative OB ROS                             Anesthesia Physical Anesthesia Plan  ASA: 2  Anesthesia Plan: General ETT   Post-op Pain Management:    Induction: Intravenous  PONV Risk Score and Plan: 3 and Ondansetron, Dexamethasone and Midazolam  Airway Management Planned: Oral ETT  Additional Equipment:   Intra-op Plan:   Post-operative Plan: Extubation in OR  Informed Consent: I have reviewed the patients History and Physical, chart, labs and discussed the procedure including the risks, benefits and alternatives for the proposed anesthesia with the patient or authorized representative who has indicated his/her understanding and acceptance.     Dental Advisory Given  Plan Discussed with: Anesthesiologist, CRNA and Surgeon  Anesthesia Plan Comments: (Patient consented for risks of anesthesia including but not limited to:  - adverse reactions to  medications - damage to eyes, teeth, lips or other oral mucosa - nerve damage due to positioning  - sore throat or hoarseness - Damage to heart, brain, nerves, lungs, other parts of body or loss of life  Patient voiced understanding and assent.)       Anesthesia Quick Evaluation

## 2024-02-18 NOTE — Anesthesia Procedure Notes (Signed)
 Procedure Name: Intubation Date/Time: 02/18/2024 8:13 AM  Performed by: [name], CRNAPre-anesthesia Checklist: Patient identified, Emergency Drugs available, Suction available, Patient being monitored and Timeout performed Patient Re-evaluated:Patient Re-evaluated prior to induction Oxygen Delivery Method: Circle system utilized Preoxygenation: Pre-oxygenation with 100% oxygen Induction Type: IV induction Ventilation: Mask ventilation without difficulty Laryngoscope Size: McGrath and 3 Grade View: Grade I Tube type: Oral Tube size: 6.5 mm Number of attempts: 1 Airway Equipment and Method: Stylet and Video-laryngoscopy Placement Confirmation: ETT inserted through vocal cords under direct vision, positive ETCO2 and breath sounds checked- equal and bilateral Secured at: 20 cm Tube secured with: Tape Dental Injury: Teeth and Oropharynx as per pre-operative assessment

## 2024-02-18 NOTE — Op Note (Signed)
 Kathleen Hardy PROCEDURE DATE: 02/18/2024  PREOPERATIVE DIAGNOSIS: Right ectopic pregnancy POSTOPERATIVE DIAGNOSIS: right fallopian tube ectopic pregnancy with hemoperitoneum but not entirely ruptured PROCEDURE: Laparoscopic right salpingectomy and removal of ectopic pregnancy SURGEON:  Dr. Christeen Hardy ANESTHESIOLOGIST: No responsible provider has been recorded for the case. Anesthesiologist: Kathleen Coup, MD CRNA: Kathleen Brave, CRNA; Kathleen Hardy., CRNA  INDICATIONS: 38 y.o. G1P0000 at 6 weeks here with the preoperative diagnoses as listed above.  Please refer to preoperative notes for more details. Patient was counseled regarding need for laparoscopic salpingectomy. Risks of surgery including bleeding which may require transfusion or reoperation, infection, injury to bowel or other surrounding organs, need for additional procedures including laparotomy and other postoperative/anesthesia complications were explained to patient.  Written informed consent was obtained.  FINDINGS:  Small amount of hemoperitoneum estimated to be about 50 of blood and clots.  Dilated right fallopian tube containing ectopic gestation. Small normal appearing uterus, normal left fallopian tube, right ovary and left ovary. 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  The left tube initially did not spill freely, but as adhesions were removed it did eventually end up spilling the methylene dye sterile fluid, and confirmed open. It is still curled into the left ovarian fossa.  Normal appendix.  Bilateral ureters peristalsing  ANESTHESIA: General INTRAVENOUS FLUIDS: 400 ml ESTIMATED BLOOD LOSS: 30 ml URINE OUTPUT: 100 ml SPECIMENS: Right fallopian tube containing ectopic gestation COMPLICATIONS: None immediate  PROCEDURE IN DETAIL:  The patient was taken to the operating room where general anesthesia was administered and was found to be adequate.  She was placed in the dorsal lithotomy  position, and was prepped and draped in a sterile manner.  Her bladder was catheterized for an estimated amount of clear, yellow urine. A weighed speculum was then placed in the patient's vagina and a single tooth tenaculum was applied to the anterior lip of the cervix.  An acorn was placed with sterile blue dyed fluid.   Attention was turned to the abdomen where an umbilical incision was made with the scalpel.  The Optiview 5-mm trocar and sleeve were then advanced without difficulty. Survey of the entry site was noted to be without damage. The abdomen was then insufflated with carbon dioxide gas and adequate pneumoperitoneum was obtained.  A survey of the patient's pelvis and abdomen revealed the findings above.  A 11-mm port was placed on the left under direct visualization, and a 5-mm right lower quadrant port was then placed under direct visualization.  The Nezhat suction irrigator was then used to suction the hemoperitoneum and irrigate the pelvis.  A survey of the pelvis noted ureter peristalsis deep to the ovarian ligament and not in the surgical field. Attention was then turned to the left fallopian tube and ovary. The left tube was separated from the underlying mesosalpinx and uterine attachment using the Ligasure instrument.  Good hemostasis was noted.    The left fallopian tube was scarred down into the left ovarian fossa.  Initially, "chromotubation was negative.  However, with sharp removal of these adhesions the fallopian tube did spill blue dyed fluid.  Chromotubation was successful.  The specimen was placed in an EndoCatch bag and removed from the abdomen intact.  The abdomen was desufflated, and all instruments were removed.  The fascial incision of the 10-mm site was reapproximated with a 0 Vicryl figure-of-eight stitch; and all skin incisions were closed with 4-0 Vicryl and Dermabond. The patient tolerated the procedure well.  All instruments, needles, and  sponge counts were correct x 2.  The patient was taken to the recovery room in stable condition.   He will be discharged home today and I will see her in the office in 2 weeks for follow-up.  Her medications were sent to [facility] at [facility]. Kathleen Cools, MD, MPH

## 2024-02-18 NOTE — Discharge Summary (Signed)
 Patient discharged home with family. Discharge instructions and prescriptions reviewed with patient. Pt understands that she is to make apt at [facility] with Dr Dalbert Garnet for her follow up in 2 weeks. Pt was given a work note for the 2 weeks until her post-op visit.   Patient verbalized understanding. Patient will be escorted out by auxiliary.

## 2024-02-18 NOTE — Transfer of Care (Signed)
 Immediate Anesthesia Transfer of Care Note  Patient: Kathleen Hardy  Procedure(s) Performed: LAPAROSCOPYWITH RIGHT SALPINGECTOMY, ECTOPIC PREGNANCY SURGICAL TREATMENT (Right) CHROMOPERTUBATION, FALLOPIAN TUBE  Patient Location: PACU  Anesthesia Type:General  Level of Consciousness: awake, oriented, drowsy, and patient cooperative  Airway & Oxygen Therapy: Patient Spontanous Breathing and Patient connected to face mask oxygen  Post-op Assessment: Report given to RN and Post -op Vital signs reviewed and stable  Post vital signs: Reviewed and stable  Last Vitals:  Vitals Value Taken Time  BP 132/69 02/18/24 0930  Temp 97   Pulse 87 02/18/24 0932  Resp 27 02/18/24 0932  SpO2 100 % 02/18/24 0932  Vitals shown include unfiled device data.  Last Pain:  Vitals:   02/18/24 0640  TempSrc: Temporal  PainSc: 0-No pain         Complications: No notable events documented.

## 2024-02-18 NOTE — H&P (Addendum)
 Consult History and Physical   SERVICE: Gynecology   Patient Name: Kathleen Hardy Patient MRN:   [id number]  CC: abdominal pain   HPI: Kathleen Hardy is a 38 y.o. G1P0000 who presented to the emergency department last night with lower abdominal pain. She had a positive pregnancy test at home on Monday and was seen by her provider yesterday for a pregnancy confirmation appointment. Pain started to get worse last night and it became severe particularly in the lower abdominal area.  She denies vaginal bleeding. Reports she's had nausea for the past week. Denies other acute medical concerns. US findings were significant for gestational sac with fetal pole and cardiac activity seen within the right adnexa corresponding to 6 weeks and 6 days. No intrauterine pregnancy was seen and beta hcg quants measuring [id number]. OB/GYN was consulted for evaluation and management of right ectopic pregnancy.    Review of Systems: positives in bold GEN:   fevers, chills, weight changes, appetite changes, fatigue, night sweats HEENT:  HA, vision changes, hearing loss, congestion, rhinorrhea, sinus pressure, dysphagia CV:   CP, palpitations PULM:  SOB, cough GI:  abd pain, N/V/D/C GU:  dysuria, urgency, frequency MSK:  arthralgias, myalgias, back pain, swelling SKIN:  rashes, color changes, pallor NEURO:  numbness, weakness, tingling, seizures, dizziness, tremors PSYCH:  depression, anxiety, behavioral problems, confusion  HEME/LYMPH:  easy bruising or bleeding ENDO:  heat/cold intolerance  Past Obstetrical History: OB History     Gravida  1   Para  0   Term  0   Preterm  0   AB  0   Living  0      SAB  0   IAB  0   Ectopic  0   Multiple  0   Live Births  0        Obstetric Comments  1st Menstrual Cycle:  18           Past Gynecologic History: Patient's last menstrual period was 12/14/2023 (approximate).   Past Medical History: Past Medical History:  Diagnosis Date   PCOS  (polycystic ovarian syndrome)     Past Surgical History:   Past Surgical History:  Procedure Laterality Date   WISDOM TOOTH EXTRACTION      Family History:  family history includes Breast cancer in her maternal grandmother and maternal great-grandmother; Diabetes in her maternal grandmother, maternal great-grandmother, and mother; Heart disease in her maternal grandmother and mother; Hypertension in her maternal grandmother, maternal great-grandmother, and mother; Schizophrenia in her brother.  Social History:  Social History   Socioeconomic History   Marital status: Single    Spouse name: Not on file   Number of children: Not on file   Years of education: Not on file   Highest education level: Not on file  Occupational History   Not on file  Tobacco Use   Smoking status: Former    Current packs/day: 0.50    Average packs/day: 0.5 packs/day for 15.0 years (7.5 ttl pk-yrs)    Types: Cigarettes, E-cigarettes, Cigars   Smokeless tobacco: Never   Tobacco comments:    2 packs a week  Vaping Use   Vaping status: Former   Substances: Flavoring  Substance and Sexual Activity   Alcohol use: Not Currently    Alcohol/week: 4.0 standard drinks of alcohol    Types: 3 Shots of liquor, 1 Standard drinks or equivalent per week    Comment: every other weekend-Last drink 2 days ago   Drug use: Yes  Frequency: 7.0 times per week    Types: Marijuana    Comment: last use of cocain was over 1 year ago (02/14/2021) every morning marijuana user   Sexual activity: Yes    Partners: Male, Female    Birth control/protection: Condom    Comment: Condoms sometimes  Other Topics Concern   Not on file  Social History Narrative   Not on file   Social Drivers of Health   Financial Resource Strain: Not on file  Food Insecurity: Not on file  Transportation Needs: Not on file  Physical Activity: Not on file  Stress: Not on file  Social Connections: Not on file  Intimate Partner Violence: Not  At Risk (07/23/2023)   Humiliation, Afraid, Rape, and Kick questionnaire    Fear of Current or Ex-Partner: No    Emotionally Abused: No    Physically Abused: No    Sexually Abused: No    Home Medications:  Medications reconciled in EPIC  No current facility-administered medications on file prior to encounter.   Current Outpatient Medications on File Prior to Encounter  Medication Sig Dispense Refill   albuterol (PROVENTIL HFA;VENTOLIN HFA) 108 (90 Base) MCG/ACT inhaler Inhale 2 puffs into the lungs every 6 (six) hours as needed for wheezing or shortness of breath. 1 Inhaler 0   famotidine (PEPCID) 20 MG tablet Take 1 tablet (20 mg total) by mouth 2 (two) times daily. 60 tablet 0   predniSONE (DELTASONE) 10 MG tablet Take 3 tablets once a day for 5 days 15 tablet 0    Allergies:  Allergies  Allergen Reactions   Shellfish Allergy Itching and Swelling    Throat itching tongue swelling     Physical Exam:  Temp:  [98.2 F (36.8 C)] 98.2 F (36.8 C) (03/26 2149) Pulse Rate:  [86] 86 (03/26 2145) Resp:  [18] 18 (03/26 2145) BP: (145)/(82) 145/82 (03/26 2145) SpO2:  [100 %] 100 % (03/26 2145) Weight:  [79.8 kg] 79.8 kg (03/26 2146)   General Appearance:  Well developed, well nourished, in no distress, alert and oriented x3 HEENT:  Normocephalic atraumatic, extraocular movements intact, moist mucous membranes Pulmonary:  normal respiratory effort  Abdomen:  Bowel sounds present, tenderness to palpation in RLQ Extremities:  Full range of motion, no pedal edema, 2+ distal pulses, no tenderness Skin:  normal coloration and turgor, no rashes, no suspicious skin lesions noted  Neurologic:  Cranial nerves 2-12 grossly intact, normal muscle tone Psychiatric:  Normal mood and affect, appropriate, no AH/VH Pelvic:  deferred   Labs/Studies:   CBC and Coags:  Lab Results  Component Value Date   WBC 13.3 (H) 02/17/2024   NEUTOPHILPCT 59 07/23/2017   EOSPCT 1 07/23/2017   BASOPCT 1  07/23/2017   LYMPHOPCT 33 07/23/2017   HGB 12.7 02/17/2024   HCT 36.0 02/17/2024   MCV 88.2 02/17/2024   PLT 386 02/17/2024   CMP:  Lab Results  Component Value Date   NA 133 (L) 02/17/2024   K 3.5 02/17/2024   CL 103 02/17/2024   CO2 23 02/17/2024   BUN 12 02/17/2024   CREATININE 0.94 02/17/2024   CREATININE 0.85 04/17/2022   CREATININE 0.94 08/27/2019   PROT 7.7 02/17/2024   BILITOT 0.8 02/17/2024   ALT 14 02/17/2024   AST 18 02/17/2024   ALKPHOS 40 02/17/2024   Other Labs: Beta hcg:   Latest Reference Range & Units 02/17/24 21:49  HCG, Beta Chain, Quant, S <5 mIU/mL 23,683 (H)  (H): Data is abnormally  high  Blood type: pending   Other Imaging: US OB LESS THAN 14 WEEKS WITH OB TRANSVAGINAL Result Date: 02/18/2024 CLINICAL DATA:  Cramping. EXAM: OBSTETRIC <14 WK [place] AND TRANSVAGINAL OB US TECHNIQUE: Both transabdominal and transvaginal ultrasound examinations were performed for complete evaluation of the gestation as well as the maternal uterus, adnexal regions, and pelvic cul-de-sac. Transvaginal technique was performed to assess early pregnancy. COMPARISON:  None Available. FINDINGS: Uterus: Measures 9.9 x 5.7 x 6.0 cm. Intrauterine gestational sac: None. Endometrium: Measures 1.5 cm in thickness, mildly heterogeneous. Right adnexa: The right ovary is visualized and appears within normal limits measuring 3.9 x 2.0 x 2.7 cm. Within the right adnexa there is a focal mass containing single gestational sac, yolk sac and fetal pole. Crown-rump length measures 8.4 mm corresponding to gestational age of [redacted] weeks and 6 days. Cardiac activity is visualized with a heart rate of 137 beats per minute. Left adnexa: The left ovary appears normal measuring 3.9 x 2.4 x 2.3 cm. No adnexal mass. Other: There is a small amount of free fluid in the cul-de-sac. There is also small amount of echogenic material in the cul-de-sac favored as hemorrhage. IMPRESSION: 1. Live right adnexal ectopic pregnancy  adjacent to the right ovary corresponding to gestational age of [redacted] weeks and 6 days. 2. Small amount of hemorrhage in the pelvis. These results were called by telephone at the time of interpretation on 02/18/2024 at 12:01 am to provider Dr. Modesto Charon, who verbally acknowledged these results. Electronically Signed   By: Darliss Cheney M.D.   On: 02/18/2024 00:01     Assessment / Plan:   ZINNIA TINDALL is a 38 y.o. G1P0000 who presents with right ectopic pregnancy. On exam, she had stable vital signs, and is clinically stable. Patient was counseled regarding need for surgical intervention for management of ectopic pregnancy.   Ectopic pregnancy: Causes discussed with patient, including prevalence, common causes, and outcomes. We discussed that she is not a candidate for more conservative measures like expectant management or methotrexate given the presence of cardiac activity and high beta hcg levels.   Risks of dx lap with salpingectomy and evacuation of hemoperitoneum surgery were discussed with the patient including but not limited to: bleeding which may require transfusion; infection which may require antibiotics; injury to uterus or surrounding organs; intrauterine scarring which may impair future fertility if D&C is required; need for additional procedures including laparotomy or laparoscopy; and other postoperative/anesthesia complications. Written informed consent was obtained.  Will admit to observation to Gynecology service on Women's and Children[facility] unit (M/B).  OR notified and surgery scheduled for 7:30 this morning with Dr. Christeen Douglas.  Remain NPO Type and screen pending  Plan to repeat CBC in AM  Plan for follow up with Dr. Dalbert Garnet in 2 weeks for postoperative care  Margaretmary Eddy, CNM Certified Nurse Midwife [facility]     Pt seen and examined. Plan for lap treatment of ectopic discussed. R/B/A discussed. Pt consented to surgery, and all  questions answered.

## 2024-02-18 NOTE — Interval H&P Note (Signed)
 History and Physical Interval Note:  02/18/2024 7:57 AM  Kathleen Hardy  has presented today for surgery, with the diagnosis of Ectopic pregnancy.  The various methods of treatment have been discussed with the patient and family. After consideration of risks, benefits and other options for treatment, the patient has consented to  Procedure(s): LAPAROSCOPY, WITH ECTOPIC PREGNANCY SURGICAL TREATMENT (Right) and I have added a chromotubation to assess the contralateral tubal patency at the patient's request, as she is trying to conceive, as a surgical intervention.  The patient's history has been reviewed, patient examined, no change in status, stable for surgery.  I have reviewed the patient's chart and labs.  Questions were answered to the patient's satisfaction.     Kathleen Hardy

## 2024-02-18 NOTE — Anesthesia Postprocedure Evaluation (Signed)
 Anesthesia Post Note  Patient: Kathleen Hardy  Procedure(s) Performed: LAPAROSCOPYWITH RIGHT SALPINGECTOMY, ECTOPIC PREGNANCY SURGICAL TREATMENT (Right) CHROMOPERTUBATION, FALLOPIAN TUBE  Patient location during evaluation: PACU Anesthesia Type: General Level of consciousness: awake and alert Pain management: pain level controlled Vital Signs Assessment: post-procedure vital signs reviewed and stable Respiratory status: spontaneous breathing, nonlabored ventilation, respiratory function stable and patient connected to nasal cannula oxygen Cardiovascular status: blood pressure returned to baseline and stable Postop Assessment: no apparent nausea or vomiting Anesthetic complications: no  No notable events documented.   Last Vitals:  Vitals:   02/18/24 1015 02/18/24 1030  BP: 110/61 105/64  Pulse: 61 (!) 59  Resp: 16 17  Temp:  36.8 C  SpO2: 100% 100%    Last Pain:  Vitals:   02/18/24 1030  TempSrc:   PainSc: 5                  [name]

## 2024-02-19 ENCOUNTER — Encounter: Payer: Self-pay | Admitting: Obstetrics and Gynecology

## 2024-02-19 LAB — SURGICAL PATHOLOGY

## 2024-02-25 NOTE — Discharge Summary (Signed)
 Discharged home on day of surgery in stable condition

## 2024-07-28 ENCOUNTER — Other Ambulatory Visit: Payer: Self-pay

## 2024-07-28 ENCOUNTER — Emergency Department
Admission: EM | Admit: 2024-07-28 | Discharge: 2024-07-28 | Disposition: A | Discharge status: Home / Self Care | Attending: Emergency Medicine | Admitting: Emergency Medicine

## 2024-07-28 ENCOUNTER — Emergency Department

## 2024-07-28 ENCOUNTER — Encounter: Payer: Self-pay | Admitting: Emergency Medicine

## 2024-07-28 DIAGNOSIS — R109 Unspecified abdominal pain: Secondary | ICD-10-CM

## 2024-07-28 DIAGNOSIS — R1032 Left lower quadrant pain: Secondary | ICD-10-CM | POA: Diagnosis present

## 2024-07-28 DIAGNOSIS — N83202 Unspecified ovarian cyst, left side: Secondary | ICD-10-CM | POA: Diagnosis not present

## 2024-07-28 LAB — COMPREHENSIVE METABOLIC PANEL WITH GFR
ALT: 14 U/L (ref 0–44)
AST: 19 U/L (ref 15–41)
Albumin: 3.8 g/dL (ref 3.5–5.0)
Alkaline Phosphatase: 44 U/L (ref 38–126)
Anion gap: 6 (ref 5–15)
BUN: 12 mg/dL (ref 6–20)
CO2: 25 mmol/L (ref 22–32)
Calcium: 8.8 mg/dL — ABNORMAL LOW (ref 8.9–10.3)
Chloride: 106 mmol/L (ref 98–111)
Creatinine, Ser: 0.79 mg/dL (ref 0.44–1.00)
GFR, Estimated: >60 mL/min (ref >60)
Glucose, Bld: 87 mg/dL (ref 70–99)
Potassium: 4.2 mmol/L (ref 3.5–5.1)
Sodium: 137 mmol/L (ref 135–145)
Total Bilirubin: 0.8 mg/dL (ref 0.0–1.2)
Total Protein: 7 g/dL (ref 6.5–8.1)

## 2024-07-28 LAB — CBC
HCT: 37.6 % (ref 36.0–46.0)
Hemoglobin: 12.7 g/dL (ref 12.0–15.0)
MCH: 29.6 pg (ref 26.0–34.0)
MCHC: 33.8 g/dL (ref 30.0–36.0)
MCV: 87.6 fL (ref 80.0–100.0)
Platelets: 400 K/uL (ref 150–400)
RBC: 4.29 MIL/uL (ref 3.87–5.11)
RDW: 12.9 % (ref 11.5–15.5)
WBC: 8.7 K/uL (ref 4.0–10.5)
nRBC: 0 % (ref 0.0–0.2)

## 2024-07-28 LAB — LIPASE, BLOOD: Lipase: 34 U/L (ref 11–51)

## 2024-07-28 LAB — URINALYSIS, ROUTINE W REFLEX MICROSCOPIC
Bilirubin Urine: NEGATIVE
Glucose, UA: NEGATIVE mg/dL
Hgb urine dipstick: NEGATIVE
Ketones, ur: NEGATIVE mg/dL
Leukocytes,Ua: NEGATIVE
Nitrite: NEGATIVE
Protein, ur: NEGATIVE mg/dL
Specific Gravity, Urine: 1.009 (ref 1.005–1.030)
pH: 7 (ref 5.0–8.0)

## 2024-07-28 LAB — PREGNANCY, URINE: Preg Test, Ur: NEGATIVE

## 2024-07-28 NOTE — ED Triage Notes (Signed)
 Pt reports left sided flank pain that started Monday. Pt reports she has been unable to make it to the bathroom before urinating on herself. Pt denies blood in the urine. Denies fevers.

## 2024-07-28 NOTE — ED Provider Notes (Signed)
 [facility] Provider Note    Event Date/Time   First MD Initiated Contact with Patient 07/28/24 1514     (approximate)   History   Flank Pain   HPI  Kathleen Hardy is a 38 y.o. female with history of PCOS,  and as listed in EMR presents to the emergency department for treatment and evaluation of left side flank pain since Monday. She has also had urinary urgency for the past couple of weeks. When at work, she has to await a replacement if she needs to use the restroom and has leaked a little before making it there a few times. She denies other associated symptoms with the exception of some left lower abdominal pain, but associates that with a recent procedure on her fallopian tube.     Physical Exam    Vitals:   07/28/24 1256 07/28/24 1639  BP: 131/82 130/80  Pulse: (!) 101 90  Resp: 18 18  Temp: 98.4 F (36.9 C)   SpO2: 100% 100%    General: Awake, no distress.  CV:  Good peripheral perfusion.  Resp:  Normal effort.  Abd:  No distention.  Other:  No CVA tenderness. No focal lower abdominal tenderness.   ED Results / Procedures / Treatments   Labs (all labs ordered are listed, but only abnormal results are displayed)  Labs Reviewed  COMPREHENSIVE METABOLIC PANEL WITH GFR - Abnormal; Notable for the following components:      Result Value   Calcium 8.8 (*)    All other components within normal limits  URINALYSIS, ROUTINE W REFLEX MICROSCOPIC - Abnormal; Notable for the following components:   Color, Urine STRAW (*)    APPearance CLEAR (*)    All other components within normal limits  LIPASE, BLOOD  CBC  PREGNANCY, URINE  POC URINE PREG, ED     EKG  Not indicated.   RADIOLOGY  Image and radiology report reviewed and interpreted by me. Radiology report consistent with the same.  No acute intra-abdominal or pelvic abnormality. Small left ovarian cyst measuring 3.9cm  PROCEDURES:  Critical Care performed:  No  Procedures   MEDICATIONS ORDERED IN ED:  Medications - No data to display   IMPRESSION / MDM / ASSESSMENT AND PLAN / ED COURSE   I have reviewed the triage note and vital signs. Vital signs stable.   Differential diagnosis includes, but is not limited to, pyelonephritis, acute cystitis, musculoskeletal pain, kidney stone, ovarian cyst.  Patient's presentation is most consistent with acute illness / injury with system symptoms.  38 year old female presenting to the emergency department for treatment and evaluation of left lower back pain that radiates into the left lower abdomen at times.  See HPI for further details.  Exam is reassuring.  There is no CVA tenderness.  CBC is normal.  CMP is also normal.  Urinalysis is negative for indication of infection and has no hemoglobin or documented red blood cells.  Pregnancy test is negative.  CT for renal stone study performed that shows a simple cyst on the left ovary.  Results discussed with the patient.  She reports that she has had ovarian cysts in the past with similar back pain.  This is likely the source of her pain today.  She will take ibuprofen  or Naprosyn  and Tylenol .  She was encouraged to follow-up with her gynecologist or primary care provider if symptoms or not improving.  ER return precautions discussed.  Patient discharged in stable condition.  FINAL CLINICAL IMPRESSION(S) / ED DIAGNOSES   Final diagnoses:  Ovarian cyst, left  Left flank pain     Rx / DC Orders   ED Discharge Orders     None        Note:  This document was prepared using Dragon voice recognition software and may include unintentional dictation errors.   Herlinda Kirk NOVAK, FNP 07/28/24 1652    Waymond Lorelle Cummins, MD 07/28/24 [phone]

## 2024-10-12 ENCOUNTER — Emergency Department
Admission: EM | Admit: 2024-10-12 | Discharge: 2024-10-12 | Disposition: A | Discharge status: Home / Self Care | Attending: Emergency Medicine | Admitting: Emergency Medicine

## 2024-10-12 ENCOUNTER — Other Ambulatory Visit: Payer: Self-pay

## 2024-10-12 DIAGNOSIS — R519 Headache, unspecified: Secondary | ICD-10-CM | POA: Diagnosis present

## 2024-10-12 DIAGNOSIS — G43101 Migraine with aura, not intractable, with status migrainosus: Secondary | ICD-10-CM | POA: Insufficient documentation

## 2024-10-12 LAB — URINALYSIS, W/ REFLEX TO CULTURE (INFECTION SUSPECTED)
Bacteria, UA: NONE SEEN
Bilirubin Urine: NEGATIVE
Glucose, UA: NEGATIVE mg/dL
Ketones, ur: NEGATIVE mg/dL
Leukocytes,Ua: NEGATIVE
Nitrite: NEGATIVE
Protein, ur: NEGATIVE mg/dL
Specific Gravity, Urine: 1.018 (ref 1.005–1.030)
pH: 7 (ref 5.0–8.0)

## 2024-10-12 LAB — CBC WITH DIFFERENTIAL/PLATELET
Abs Immature Granulocytes: 0.07 K/uL (ref 0.00–0.07)
Basophils Absolute: 0.1 K/uL (ref 0.0–0.1)
Basophils Relative: 0 %
Eosinophils Absolute: 0.1 K/uL (ref 0.0–0.5)
Eosinophils Relative: 0 %
HCT: 39.9 % (ref 36.0–46.0)
Hemoglobin: 13.2 g/dL (ref 12.0–15.0)
Immature Granulocytes: 1 %
Lymphocytes Relative: 15 %
Lymphs Abs: 1.9 K/uL (ref 0.7–4.0)
MCH: 29.1 pg (ref 26.0–34.0)
MCHC: 33.1 g/dL (ref 30.0–36.0)
MCV: 88.1 fL (ref 80.0–100.0)
Monocytes Absolute: 0.6 K/uL (ref 0.1–1.0)
Monocytes Relative: 4 %
Neutro Abs: 10.7 K/uL — ABNORMAL HIGH (ref 1.7–7.7)
Neutrophils Relative %: 80 %
Platelets: 428 K/uL — ABNORMAL HIGH (ref 150–400)
RBC: 4.53 MIL/uL (ref 3.87–5.11)
RDW: 13.3 % (ref 11.5–15.5)
WBC: 13.4 K/uL — ABNORMAL HIGH (ref 4.0–10.5)
nRBC: 0 % (ref 0.0–0.2)

## 2024-10-12 LAB — BASIC METABOLIC PANEL WITH GFR
Anion gap: 7 (ref 5–15)
BUN: 10 mg/dL (ref 6–20)
CO2: 25 mmol/L (ref 22–32)
Calcium: 9.1 mg/dL (ref 8.9–10.3)
Chloride: 105 mmol/L (ref 98–111)
Creatinine, Ser: 0.78 mg/dL (ref 0.44–1.00)
GFR, Estimated: >60 mL/min (ref >60)
Glucose, Bld: 101 mg/dL — ABNORMAL HIGH (ref 70–99)
Potassium: 4.7 mmol/L (ref 3.5–5.1)
Sodium: 136 mmol/L (ref 135–145)

## 2024-10-12 LAB — POC URINE PREG, ED: Preg Test, Ur: NEGATIVE

## 2024-10-12 MED ORDER — METOCLOPRAMIDE HCL 10 MG PO TABS
10.0000 mg | ORAL_TABLET | Freq: Four times a day (QID) | ORAL | 0 refills | Status: AC | PRN
Start: 2024-10-12 — End: ?

## 2024-10-12 MED ORDER — METOCLOPRAMIDE HCL 5 MG/ML IJ SOLN
10.0000 mg | INTRAMUSCULAR | Status: AC
  Administered 2024-10-12: 10 mg via INTRAVENOUS
  Filled 2024-10-12: qty 2

## 2024-10-12 MED ORDER — SODIUM CHLORIDE 0.9 % IV BOLUS
1000.0000 mL | Freq: Once | INTRAVENOUS | Status: AC
  Administered 2024-10-12: 1000 mL via INTRAVENOUS

## 2024-10-12 MED ORDER — NAPROXEN 500 MG PO TABS
500.0000 mg | ORAL_TABLET | Freq: Two times a day (BID) | ORAL | 0 refills | Status: AC
Start: 2024-10-12 — End: ?

## 2024-10-12 MED ORDER — DEXAMETHASONE SOD PHOSPHATE PF 10 MG/ML IJ SOLN
10.0000 mg | Freq: Once | INTRAMUSCULAR | Status: AC
  Administered 2024-10-12: 10 mg via INTRAVENOUS

## 2024-10-12 NOTE — ED Triage Notes (Signed)
 Patient states headache and blurred vision in bilateral eyes that started this morning; also had two episodes of vomiting.

## 2024-10-12 NOTE — ED Provider Notes (Signed)
 [facility] Provider Note    Event Date/Time   First MD Initiated Contact with Patient 10/12/24 1035     (approximate)   History   Chief Complaint: Headache   HPI  Kathleen Hardy is a 38 y.o. female with history of PCOS who comes ED complaining of frontal headache starting this morning, gradual onset, constant, severe.  Associated blurry vision, kaleidoscope distortion of her vision.  No neck pain or stiffness.  No fever.  Did have vomiting.  Also endorses photophobia and phonophobia.  Has had headaches like this in the past.  A year ago was seen and given Compazine  which worked, but she tried that this morning without relief.  Denies trauma.        Past Medical History:  Diagnosis Date   PCOS (polycystic ovarian syndrome)     Current Outpatient Rx   Order #: [id number] Class: Normal   Order #: [id number] Class: Normal   Order #: [id number] Class: Print   Order #: [id number] Class: Normal   Order #: [id number] Class: Normal   Order #: [id number] Class: Normal   Order #: [id number] Class: Normal    Past Surgical History:  Procedure Laterality Date   CHROMOPERTUBATION  02/18/2024   Procedure: CHROMOPERTUBATION, FALLOPIAN TUBE;  Surgeon: Verdon Keen, MD;  Location: [facility] ORS;  Service: Gynecology;;   DIAGNOSTIC LAPAROSCOPY WITH REMOVAL OF ECTOPIC PREGNANCY Right 02/18/2024   Procedure: LAPAROSCOPYWITH RIGHT SALPINGECTOMY, ECTOPIC PREGNANCY SURGICAL TREATMENT;  Surgeon: Verdon Keen, MD;  Location: [facility] OR[facility];  Service: Gynecology;  Laterality: Right;   WISDOM TOOTH EXTRACTION      Physical Exam   Triage Vital Signs: ED Triage Vitals  Encounter Vitals Group     BP 10/12/24 0955 117/82     Girls Systolic BP Percentile --      Girls Diastolic BP Percentile --      Boys Systolic BP Percentile --      Boys Diastolic BP Percentile --      Pulse Rate 10/12/24 0955 71     Resp 10/12/24 0955 20     Temp 10/12/24 0955 99.1 F (37.3 C)     Temp Source  10/12/24 0955 Oral     SpO2 10/12/24 0955 100 %     Weight 10/12/24 0956 177 lb (80.3 kg)     Height 10/12/24 0956 5' 7 (1.702 m)     Head Circumference --      Peak Flow --      Pain Score --      Pain Loc --      Pain Education --      Exclude from Growth Chart --     Most recent vital signs: Vitals:   10/12/24 1030 10/12/24 1302  BP: (!) 143/87 120/84  Pulse: (!) 102 64  Resp:  18  Temp:  98.2 F (36.8 C)  SpO2: 100% 100%    General: Awake, no distress.  CV:  Good peripheral perfusion.  Regular rate rhythm Resp:  Normal effort.  Abd:  No distention.  Other:  Cranial nerves III through XII intact.  Moving all extremities, normal cerebellar function   ED Results / Procedures / Treatments   Labs (all labs ordered are listed, but only abnormal results are displayed) Labs Reviewed  URINALYSIS, W/ REFLEX TO CULTURE (INFECTION SUSPECTED) - Abnormal; Notable for the following components:      Result Value   Color, Urine YELLOW (*)    APPearance HAZY (*)    Hgb urine dipstick MODERATE (*)  All other components within normal limits  BASIC METABOLIC PANEL WITH GFR - Abnormal; Notable for the following components:   Glucose, Bld 101 (*)    All other components within normal limits  CBC WITH DIFFERENTIAL/PLATELET - Abnormal; Notable for the following components:   WBC 13.4 (*)    Platelets 428 (*)    Neutro Abs 10.7 (*)    All other components within normal limits  POC URINE PREG, ED - Normal     EKG    RADIOLOGY    PROCEDURES:  Procedures   MEDICATIONS ORDERED IN ED: Medications  sodium chloride  0.9 % bolus 1,000 mL (0 mLs Intravenous Stopped 10/12/24 1423)  dexamethasone  (DECADRON ) injection 10 mg (10 mg Intravenous Given 10/12/24 1254)  metoCLOPramide  (REGLAN ) injection 10 mg (10 mg Intravenous Given 10/12/24 1254)     IMPRESSION / MDM / ASSESSMENT AND PLAN / ED COURSE  I reviewed the triage vital signs and the nursing notes.  DDx: Recurrent  migraine, UTI, pregnancy, anemia, electrolyte derangement, AKI  Patient's presentation is most consistent with acute presentation with potential threat to life or bodily function.  Patient presents with migraine type headache with aura.  Will give IV fluids, medications for supportive care, check labs.   ----------------------------------------- 3:09 PM on 10/12/2024 ----------------------------------------- Feels much better.  Stable for discharge.      FINAL CLINICAL IMPRESSION(S) / ED DIAGNOSES   Final diagnoses:  Migraine with aura and with status migrainosus, not intractable     Rx / DC Orders   ED Discharge Orders          Ordered    naproxen  (NAPROSYN ) 500 MG tablet  2 times daily with meals        10/12/24 1508    metoCLOPramide  (REGLAN ) 10 MG tablet  Every 6 hours PRN        10/12/24 1508             Note:  This document was prepared using Dragon voice recognition software and may include unintentional dictation errors.   Viviann Pastor, MD 10/12/24 [phone]

## 2024-10-12 NOTE — Discharge Instructions (Addendum)
 Take Reglan  and naproxen  as needed for recurrent migraine headache.  Continue to eat regularly and drink plenty of fluids to stay well-hydrated.

## 2024-10-12 NOTE — ED Notes (Signed)
 Patient states headache is much improved after medication and fluid administration.

## 2024-10-14 ENCOUNTER — Ambulatory Visit
